# Patient Record
Sex: Female | Born: 1945 | Race: White | Hispanic: No | Marital: Married | Smoking: Former smoker
Health system: Southern US, Community
[De-identification: ages and names within clinical notes are randomized; demographics above are authoritative.]

## PROBLEM LIST (undated history)

## (undated) DIAGNOSIS — G4730 Sleep apnea, unspecified: Secondary | ICD-10-CM

## (undated) DIAGNOSIS — B259 Cytomegaloviral disease, unspecified: Secondary | ICD-10-CM

## (undated) DIAGNOSIS — B019 Varicella without complication: Secondary | ICD-10-CM

## (undated) DIAGNOSIS — T8859XA Other complications of anesthesia, initial encounter: Secondary | ICD-10-CM

## (undated) DIAGNOSIS — J439 Emphysema, unspecified: Secondary | ICD-10-CM

## (undated) DIAGNOSIS — E119 Type 2 diabetes mellitus without complications: Secondary | ICD-10-CM

## (undated) DIAGNOSIS — T4145XA Adverse effect of unspecified anesthetic, initial encounter: Secondary | ICD-10-CM

## (undated) DIAGNOSIS — N390 Urinary tract infection, site not specified: Secondary | ICD-10-CM

## (undated) HISTORY — PX: TUBAL LIGATION: SHX77

## (undated) HISTORY — DX: Sleep apnea, unspecified: G47.30

## (undated) HISTORY — PX: CHOLECYSTECTOMY: SHX55

## (undated) HISTORY — DX: Emphysema, unspecified: J43.9

## (undated) HISTORY — DX: Cytomegaloviral disease, unspecified: B25.9

## (undated) HISTORY — DX: Urinary tract infection, site not specified: N39.0

## (undated) HISTORY — DX: Type 2 diabetes mellitus without complications: E11.9

## (undated) HISTORY — DX: Varicella without complication: B01.9

## (undated) HISTORY — PX: ABDOMINAL HYSTERECTOMY: SHX81

---

## 2007-01-06 ENCOUNTER — Ambulatory Visit: Payer: Self-pay | Admitting: Internal Medicine

## 2007-01-30 ENCOUNTER — Ambulatory Visit: Payer: Self-pay | Admitting: Internal Medicine

## 2007-02-05 ENCOUNTER — Ambulatory Visit: Payer: Self-pay | Admitting: Internal Medicine

## 2007-03-08 ENCOUNTER — Ambulatory Visit: Payer: Self-pay | Admitting: Internal Medicine

## 2007-04-07 ENCOUNTER — Ambulatory Visit: Payer: Self-pay | Admitting: Internal Medicine

## 2007-05-08 ENCOUNTER — Ambulatory Visit: Payer: Self-pay | Admitting: Internal Medicine

## 2007-06-08 ENCOUNTER — Ambulatory Visit: Payer: Self-pay | Admitting: Internal Medicine

## 2007-07-08 ENCOUNTER — Ambulatory Visit: Payer: Self-pay | Admitting: Internal Medicine

## 2007-08-08 ENCOUNTER — Ambulatory Visit: Payer: Self-pay | Admitting: Internal Medicine

## 2007-09-18 ENCOUNTER — Ambulatory Visit: Payer: Self-pay | Admitting: Specialist

## 2007-09-28 ENCOUNTER — Ambulatory Visit: Payer: Self-pay | Admitting: Internal Medicine

## 2007-10-08 ENCOUNTER — Ambulatory Visit: Payer: Self-pay | Admitting: Internal Medicine

## 2007-11-08 ENCOUNTER — Ambulatory Visit: Payer: Self-pay | Admitting: Internal Medicine

## 2007-12-06 ENCOUNTER — Ambulatory Visit: Payer: Self-pay | Admitting: Internal Medicine

## 2008-01-28 ENCOUNTER — Ambulatory Visit: Payer: Self-pay | Admitting: Internal Medicine

## 2008-02-05 ENCOUNTER — Ambulatory Visit: Payer: Self-pay | Admitting: Internal Medicine

## 2008-03-07 ENCOUNTER — Ambulatory Visit: Payer: Self-pay | Admitting: Internal Medicine

## 2008-04-06 ENCOUNTER — Ambulatory Visit: Payer: Self-pay | Admitting: Internal Medicine

## 2008-05-30 ENCOUNTER — Ambulatory Visit: Payer: Self-pay | Admitting: Internal Medicine

## 2008-06-07 ENCOUNTER — Ambulatory Visit: Payer: Self-pay | Admitting: Internal Medicine

## 2008-07-26 ENCOUNTER — Ambulatory Visit: Payer: Self-pay | Admitting: Internal Medicine

## 2008-08-07 ENCOUNTER — Ambulatory Visit: Payer: Self-pay | Admitting: Internal Medicine

## 2008-11-23 ENCOUNTER — Ambulatory Visit: Payer: Self-pay | Admitting: Internal Medicine

## 2008-12-05 ENCOUNTER — Ambulatory Visit: Payer: Self-pay | Admitting: Internal Medicine

## 2009-01-05 ENCOUNTER — Ambulatory Visit: Payer: Self-pay | Admitting: Internal Medicine

## 2009-02-04 ENCOUNTER — Ambulatory Visit: Payer: Self-pay | Admitting: Internal Medicine

## 2009-03-07 ENCOUNTER — Ambulatory Visit: Payer: Self-pay | Admitting: Internal Medicine

## 2009-04-06 ENCOUNTER — Ambulatory Visit: Payer: Self-pay | Admitting: Internal Medicine

## 2009-06-14 ENCOUNTER — Ambulatory Visit: Payer: Self-pay | Admitting: Internal Medicine

## 2009-07-07 ENCOUNTER — Ambulatory Visit: Payer: Self-pay | Admitting: Internal Medicine

## 2009-10-07 HISTORY — PX: LUNG TRANSPLANT, DOUBLE: SHX704

## 2009-11-02 ENCOUNTER — Ambulatory Visit: Payer: Self-pay | Admitting: Unknown Physician Specialty

## 2013-11-26 ENCOUNTER — Encounter: Payer: Self-pay | Admitting: Internal Medicine

## 2013-11-26 ENCOUNTER — Ambulatory Visit (INDEPENDENT_AMBULATORY_CARE_PROVIDER_SITE_OTHER): Payer: PRIVATE HEALTH INSURANCE | Admitting: Internal Medicine

## 2013-11-26 VITALS — BP 152/66 | HR 92 | Temp 98.6°F | Resp 16 | Ht 62.5 in | Wt 160.5 lb

## 2013-11-26 DIAGNOSIS — R5383 Other fatigue: Secondary | ICD-10-CM

## 2013-11-26 DIAGNOSIS — R635 Abnormal weight gain: Secondary | ICD-10-CM

## 2013-11-26 DIAGNOSIS — R5381 Other malaise: Secondary | ICD-10-CM

## 2013-11-26 DIAGNOSIS — G4700 Insomnia, unspecified: Secondary | ICD-10-CM

## 2013-11-26 DIAGNOSIS — Z1239 Encounter for other screening for malignant neoplasm of breast: Secondary | ICD-10-CM

## 2013-11-26 DIAGNOSIS — Z942 Lung transplant status: Secondary | ICD-10-CM

## 2013-11-26 DIAGNOSIS — E785 Hyperlipidemia, unspecified: Secondary | ICD-10-CM

## 2013-11-26 DIAGNOSIS — E559 Vitamin D deficiency, unspecified: Secondary | ICD-10-CM

## 2013-11-26 DIAGNOSIS — Z90710 Acquired absence of both cervix and uterus: Secondary | ICD-10-CM | POA: Insufficient documentation

## 2013-11-26 NOTE — Patient Instructions (Addendum)
Ask your [facility] doctor about getting the Prevnar pneumonia vaccine.  We ca give it to you here if they prefer   This is  my version of a  "Low GI"  Diet:  It will still lower your blood sugars and allow you to lose 4 to 8  lbs  per month if you follow it carefully.  Your goal with exercise is a minimum of 30 minutes of aerobic exercise 5 days per week (Walking does not count once it becomes easy!)    All of the foods can be found at grocery stores and in bulk at [employer].  The Atkins protein bars and shakes are available in more varieties at Target, WalMart and [employer]'s Foods.     7 AM Breakfast:  Choose from the following:  Low carbohydrate Protein  Shakes (I recommend the EAS AdvantEdge "Carb Control" shakes  Or the low carb shakes by Atkins.    2.5 carbs   Arnold's "Sandwhich Thin"toasted  w/ peanut butter (no jelly: about 20 net carbs  "Bagel Thin" with cream cheese and salmon: about 20 carbs   a scrambled egg/bacon/cheese burrito made with Mission's "carb balance" whole wheat tortilla  (about 10 net carbs )   Avoid cereal and bananas, oatmeal and cream of wheat and grits. They are loaded with carbohydrates!   10 AM: high protein snack  Protein bar by Atkins (the snack size, under 200 cal, usually < 6 net carbs).    A stick of cheese:  Around 1 carb,  100 cal     Dannon Light n Fit [place] Yogurt  (80 cal, 8 carbs)  Other so called "protein bars" and Greek yogurts tend to be loaded with carbohydrates.  Remember, in food advertising, the word "energy" is synonymous for " carbohydrate."  Lunch:   A Sandwich using the bread choices listed, Can use any  Eggs,  lunchmeat, grilled meat or canned tuna), avocado, regular mayo/mustard  and cheese.  A Salad using blue cheese, ranch,  Goddess or vinagrette,  No croutons or "confetti" and no "candied nuts" but regular nuts OK.   No pretzels or chips.  Pickles and miniature sweet peppers are a good low carb alternative that provide a "crunch"  The bread  is the only source of carbohydrate in a sandwich and  can be decreased by trying some of these alternatives to traditional loaf bread  Joseph's makes a pita bread and a flat bread that are 50 cal and 4 net carbs available at BJs and WalMart.  This can be toasted to use with hummous as well  Toufayan makes a low carb flatbread that's 100 cal and 9 net carbs available at [employer] and [employer]  Mission makes 2 sizes of  Low carb whole wheat tortilla  (The large one is 210 cal and 6 net carbs) Avoid "Low fat dressings, as well as Reyne Dumas[place]ina and [address] dressings They are loaded with sugar!   3 PM/ Mid day  Snack:  Consider  1 ounce of  almonds, walnuts, pistachios, pecans, peanuts,  Macadamia nuts or a nut medley.  Avoid "granola"; the dried cranberries and raisins are loaded with carbohydrates. Mixed nuts as long as there are no raisins,  cranberries or dried fruit.     6 PM  Dinner:     Meat/fowl/fish with a green salad, and either broccoli, cauliflower, green beans, spinach, brussel sprouts or  Lima beans. DO NOT BREAD THE PROTEIN!!      There is  a low carb pasta by Dreamfield's that is acceptable and tastes great: only 5 digestible carbs/serving.( All grocery stores but BJs carry it )  Try Kai Levins Angelo's chicken piccata or chicken or eggplant parm over low carb pasta.(Lowes and BJs)   Clifton Custard Sanchez's "Carnitas" (pulled pork, no sauce,  0 carbs) or his beef pot roast to make a dinner burrito (at BJ's)  Pesto over low carb pasta (bj's sells a good quality pesto in the center refrigerated section of the deli   Whole wheat pasta is still full of digestible carbs and  Not as low in glycemic index as Dreamfield's.   Brown rice is still rice,  So skip the rice and noodles if you eat [place] or [place] (or at least limit to 1/2 cup)  9 PM snack :   Breyer's "low carb" fudgsicle or  ice cream bar (Carb Smart line), or  Weight Watcher's ice cream bar , or another "no sugar added" ice cream;  a  serving of fresh berries/cherries with whipped cream   Cheese or DANNON'S LlGHT N FIT [place] YOGURT  Avoid bananas, pineapple, grapes  and watermelon on a regular basis because they are high in sugar.  THINK OF THEM AS DESSERT  Remember that snack Substitutions should be less than 10 NET carbs per serving and meals < 20 carbs. Remember to subtract fiber grams to get the "net carbs."

## 2013-11-26 NOTE — Progress Notes (Signed)
Patient ID: Darlene Carpenter, female   DOB: 12-07-1945, 68 y.o.   MRN: [id number]    Patient Active Problem List   Diagnosis Date Noted  . Weight gain 11/28/2013  . 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 Insomnia 11/28/2013  . S/P vaginal hysterectomy 11/26/2013    Subjective:  CC:   Chief Complaint  Patient presents with  . Establish Care    HPI:   Darlene PopperBetty Hanceis a 68 y.o. female who presents Neutropenia.  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  getting labs every 2 weeks.,  Since her dose was decreased. She has a history of rejection, last year,    Weight gain.  Since her episode of lung rejection last year she has been unable to exercise  And gained wt of 10 lbs.     History of CMV,  Getting a bronch March 11.    Chronic insomnia. Uses ambien, hasn't slept without it for years. Pays out of pocket.  Last colonoscopy 2011,  3 polyps , [place] ,  Waiting for neutropenia to resolve.,   Some CKD last  Cr 1.75  highest was 2.8  during rejection       Past Medical History  Diagnosis Date  . Chicken pox   . Emphysema of lung   . UTI (lower urinary tract infection)    Allergies  Allergen Reactions  . Shellfish Allergy Swelling    Past Surgical History  Procedure Laterality Date  . Cholecystectomy    . Abdominal hysterectomy    . Lung transplant, double Bilateral 2011    History   Social History  . Marital Status: Married    Spouse Name: N/A    Number of Children: N/A  . Years of Education: N/A   Occupational History  . Not on file.   Social History Main Topics  . Smoking status: Former Smoker -- 1.00 packs/day for 45 years    Types: Cigarettes    Quit date: 11/26/2006  . Smokeless tobacco: Not on file  . Alcohol Use: No  . Drug Use: No  . Sexual Activity: No   Other Topics Concern  . Not on file   Social History Narrative  . No narrative on file   Family History  Problem Relation Age of  Onset  . Hypertension Mother   . Hypertension Father   . Cancer Brother     Review of Systems:   The rest of the review of systems was negative except those addressed in the HPI.      Objective:  BP 152/66  Pulse 92  Temp(Src) 98.6 F (37 C) (Oral)  Resp 16  Ht 5' 2.5" (1.588 m)  Wt 160 lb 8 oz (72.802 kg)  BMI 28.87 kg/m2  SpO2 99%  General appearance: alert, cooperative and appears stated age Ears: normal TM's and external ear canals both ears Throat: lips, mucosa, and tongue normal; teeth and gums normal Neck: no adenopathy, no carotid bruit, supple, symmetrical, trachea midline and thyroid not enlarged, symmetric, no tenderness/mass/nodules Back: symmetric, no curvature. ROM normal. No CVA tenderness. Lungs: clear to auscultation bilaterally Heart: regular rate and rhythm, S1, S2 normal, no murmur, click, rub or gallop Abdomen: soft, non-tender; bowel sounds normal; no masses,  no organomegaly Pulses: 2+ and symmetric Skin: Skin color, texture, turgor normal. No rashes or lesions Lymph nodes: Cervical, supraclavicular, and axillary nodes normal.  Assessment and Plan:  S/P vaginal hysterectomy 1990's  secondary to  DUB,  [place] .   Weight gain I have addressed  BMI and recommended a low glycemic index diet utilizing smaller more frequent meals to increase metabolism.  I have also recommended that patient start exercising with a goal of 30 minutes of aerobic exercise a minimum of 5 days per week. Screening for lipid disorders, thyroid and diabetes to be done today.    S/P lung transplant Secondary to COPD. 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 Now with neutropenia secondary to cell cept.   Insomnia ambien dependent.    Updated Medication List Outpatient Encounter Prescriptions as of 11/26/2013  Medication Sig  . cetirizine HCl (ZYRTEC) 5 MG/5ML SYRP Take 5 mg by mouth daily.  . furosemide (LASIX) 20 MG tablet Take 20 mg by mouth daily as needed.  . magnesium  oxide (MAG-OX) 400 MG tablet Take 800 mg by mouth 2 (two) times daily.  . metoprolol tartrate (LOPRESSOR) 25 MG tablet Take 0.5 tablets by mouth 2 (two) times daily.  . mycophenolate (CELLCEPT) 500 MG tablet Take 250 mg by mouth 2 (two) times daily.  . ondansetron (ZOFRAN) 4 MG tablet Take 4 mg by mouth every 8 (eight) hours as needed for nausea or vomiting.  . tacrolimus (PROGRAF) 0.5 MG capsule Take 0.5 mg by mouth Nightly.  . tacrolimus (PROGRAF) 1 MG capsule Take 1 mg by mouth 2 (two) times daily.  . valGANciclovir (VALCYTE) 450 MG tablet Take by mouth 2 (two) times daily.  Marland Kitchen zolpidem (AMBIEN) 10 MG tablet Take 10 mg by mouth at bedtime as needed for sleep.     Orders Placed This Encounter  Procedures  . MM DIGITAL SCREENING BILATERAL  . Lipid panel  . TSH  . Vit D  25 hydroxy (rtn osteoporosis monitoring)    No Follow-up on file.

## 2013-11-26 NOTE — Assessment & Plan Note (Signed)
1990's  secondary to [place],  [place] .

## 2013-11-28 ENCOUNTER — Encounter: Payer: Self-pay | Admitting: Internal Medicine

## 2013-11-28 DIAGNOSIS — G4700 Insomnia, unspecified: Secondary | ICD-10-CM | POA: Insufficient documentation

## 2013-11-28 DIAGNOSIS — Z942 Lung transplant status: Secondary | ICD-10-CM | POA: Insufficient documentation

## 2013-11-28 DIAGNOSIS — R635 Abnormal weight gain: Secondary | ICD-10-CM | POA: Insufficient documentation

## 2013-11-28 NOTE — Assessment & Plan Note (Signed)
I have addressed  BMI and recommended a low glycemic index diet utilizing smaller more frequent meals to increase metabolism.  I have also recommended that patient start exercising with a goal of 30 minutes of aerobic exercise a minimum of 5 days per week. Screening for lipid disorders, thyroid and diabetes to be done today.   

## 2013-11-28 NOTE — Assessment & Plan Note (Signed)
Secondary to COPD. 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 Now with neutropenia secondary to cell cept.

## 2013-11-28 NOTE — Assessment & Plan Note (Signed)
ambien dependent.

## 2013-12-14 ENCOUNTER — Ambulatory Visit: Payer: Self-pay | Admitting: Internal Medicine

## 2014-01-13 ENCOUNTER — Encounter: Payer: Self-pay | Admitting: Internal Medicine

## 2014-02-07 ENCOUNTER — Encounter: Payer: Self-pay | Admitting: Internal Medicine

## 2014-02-07 DIAGNOSIS — N183 Chronic kidney disease, stage 3 unspecified: Secondary | ICD-10-CM

## 2014-02-07 DIAGNOSIS — Z942 Lung transplant status: Secondary | ICD-10-CM

## 2014-02-07 DIAGNOSIS — J449 Chronic obstructive pulmonary disease, unspecified: Secondary | ICD-10-CM | POA: Insufficient documentation

## 2014-02-07 DIAGNOSIS — D803 Selective deficiency of immunoglobulin G [IgG] subclasses: Secondary | ICD-10-CM | POA: Insufficient documentation

## 2014-02-07 DIAGNOSIS — B259 Cytomegaloviral disease, unspecified: Secondary | ICD-10-CM | POA: Insufficient documentation

## 2014-02-07 DIAGNOSIS — D759 Disease of blood and blood-forming organs, unspecified: Secondary | ICD-10-CM | POA: Insufficient documentation

## 2014-02-07 NOTE — Assessment & Plan Note (Signed)
followed by [facility].  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

## 2014-03-07 ENCOUNTER — Emergency Department: Payer: Self-pay | Admitting: Emergency Medicine

## 2016-03-11 LAB — BASIC METABOLIC PANEL
CREATININE: 1.6 mg/dL — AB (ref 0.5–1.1)
GLUCOSE: 156 mg/dL
POTASSIUM: 3.5 mmol/L (ref 3.4–5.3)
Sodium: 141 mmol/L (ref 137–147)

## 2016-03-11 LAB — LIPID PANEL
CHOLESTEROL: 148 mg/dL (ref 0–200)
HDL: 49 mg/dL (ref 35–70)
LDL Cholesterol: 71 mg/dL
TRIGLYCERIDES: 142 mg/dL (ref 40–160)

## 2016-03-11 LAB — HEMOGLOBIN A1C: Hemoglobin A1C: 7

## 2016-07-02 ENCOUNTER — Ambulatory Visit: Payer: Medicare Other | Attending: Otolaryngology

## 2016-07-02 DIAGNOSIS — G4700 Insomnia, unspecified: Secondary | ICD-10-CM | POA: Diagnosis present

## 2016-07-02 DIAGNOSIS — Z942 Lung transplant status: Secondary | ICD-10-CM | POA: Diagnosis not present

## 2016-07-02 DIAGNOSIS — R0683 Snoring: Secondary | ICD-10-CM | POA: Insufficient documentation

## 2016-07-10 LAB — HEPATIC FUNCTION PANEL
ALT: 26 U/L (ref 7–35)
AST: 23 U/L (ref 13–35)
Alkaline Phosphatase: 124 U/L (ref 25–125)
BILIRUBIN, TOTAL: 0.7 mg/dL

## 2016-07-10 LAB — CBC AND DIFFERENTIAL
HEMATOCRIT: 36 % (ref 36–46)
HEMOGLOBIN: 12.5 g/dL (ref 12.0–16.0)
PLATELETS: 70 10*3/uL — AB (ref 150–399)
WBC: 6.6 10*3/mL

## 2016-08-09 ENCOUNTER — Encounter: Payer: Self-pay | Admitting: *Deleted

## 2016-08-09 ENCOUNTER — Ambulatory Visit (INDEPENDENT_AMBULATORY_CARE_PROVIDER_SITE_OTHER): Payer: Medicaid Other | Admitting: Internal Medicine

## 2016-08-09 ENCOUNTER — Encounter: Payer: Self-pay | Admitting: Internal Medicine

## 2016-08-09 VITALS — BP 140/76 | HR 74 | Temp 98.2°F | Resp 12 | Ht 62.0 in | Wt 180.5 lb

## 2016-08-09 DIAGNOSIS — N183 Chronic kidney disease, stage 3 unspecified: Secondary | ICD-10-CM

## 2016-08-09 DIAGNOSIS — E559 Vitamin D deficiency, unspecified: Secondary | ICD-10-CM | POA: Diagnosis not present

## 2016-08-09 DIAGNOSIS — Z1239 Encounter for other screening for malignant neoplasm of breast: Secondary | ICD-10-CM

## 2016-08-09 DIAGNOSIS — E119 Type 2 diabetes mellitus without complications: Secondary | ICD-10-CM

## 2016-08-09 DIAGNOSIS — Z1231 Encounter for screening mammogram for malignant neoplasm of breast: Secondary | ICD-10-CM

## 2016-08-09 DIAGNOSIS — E1122 Type 2 diabetes mellitus with diabetic chronic kidney disease: Secondary | ICD-10-CM

## 2016-08-09 DIAGNOSIS — E1129 Type 2 diabetes mellitus with other diabetic kidney complication: Secondary | ICD-10-CM | POA: Insufficient documentation

## 2016-08-09 DIAGNOSIS — I872 Venous insufficiency (chronic) (peripheral): Secondary | ICD-10-CM

## 2016-08-09 DIAGNOSIS — G4700 Insomnia, unspecified: Secondary | ICD-10-CM

## 2016-08-09 LAB — LIPID PANEL
CHOL/HDL RATIO: 3
Cholesterol: 157 mg/dL (ref 0–200)
HDL: 57.6 mg/dL (ref >39.00)
LDL Cholesterol: 60 mg/dL (ref 0–99)
NonHDL: 99.11
TRIGLYCERIDES: 194 mg/dL — AB (ref 0.0–149.0)
VLDL: 38.8 mg/dL (ref 0.0–40.0)

## 2016-08-09 LAB — MICROALBUMIN / CREATININE URINE RATIO
Creatinine,U: 357.1 mg/dL
MICROALB/CREAT RATIO: 0.8 mg/g (ref 0.0–30.0)
Microalb, Ur: 3 mg/dL — ABNORMAL HIGH (ref 0.0–1.9)

## 2016-08-09 LAB — HEMOGLOBIN A1C: Hgb A1c MFr Bld: 7.1 % — ABNORMAL HIGH (ref 4.6–6.5)

## 2016-08-09 LAB — VITAMIN D 25 HYDROXY (VIT D DEFICIENCY, FRACTURES): VITD: 49.19 ng/mL (ref 30.00–100.00)

## 2016-08-09 NOTE — Progress Notes (Signed)
Pre-visit discussion using our clinic review tool. No additional management support is needed unless otherwise documented below in the visit note.  

## 2016-08-09 NOTE — Progress Notes (Signed)
Patient ID: Darlene Carpenter, female    DOB: January 14, 1946  Age: 70 y.o. MRN: [id number]  The patient is here for annual Medicare wellness examination and management of other chronic and acute problems,  But has not been seen in over 2 years, .  Last seen Feb 2015 as a new patient.     Overdue for mammogram  And [facility]  Despite history of 3 polyps on prior scope (done prior to lung transplant , she thinks in 2010 somewhere in [place]) likely due to pulmonary issues and thrombocytopenia.    Cc: bilateral Edema, legs. Worse by end of day.  No ulcerations, pain or recent injury 2) Spontaneous bruises to legs and arms.  Has history of ITP, drug induced neutropenia, MGUS Platelets were 70K  last month , stable.  In  the past has had treatments with IVIG and steroids Gums do not bleed, has not seen any  blood in urine , but has had BRBPR associated with loose stools,    Occurring once or twice per month   3) [facility],  Prograf dose was reduced recently,  And IVIG was increased. [facility]. Has bronchoscopy Nov 17, routine annualHistory of early rejection managed medically in 2013.  Has no intention  of going through another  lung transplant if offered or needed,  Aware of the prognosis . Marland Kitchen.    4) Chronic insomnia: Her new doctor, Dr Karie MainlandAli,  Has discontinued her Ambien and given her a trial of rozerem.  She reports only sleeping three hours per night for the past month  Was referred for sleep study recently and reportedly did not sleep at all.  Darlene Carpenter was refilled per review of notes from [facility] via EPIC portal on Sept    5) Has had an increase in occurrences of skin cancer due to immunosuppressive therapy  6) History of Type 2 Dm previously on medication  But recently her Fasting glucose  was 130   7) CKD;  Prior workup unclear.   Not fasting, Ate toast today   Received annual flu vaccines.   Depression screen: there are no  signs or vegative symptoms of depression- irritability, change in appetite, anhedonia, sadness/tearfullness.  Cognitive assessment: the patient manages all their financial and personal affairs and is actively engaged. They could relate day,date,year and events; recalled 2/3 objects at 3 minutes; performed clock-face test normally.  The following portions of the patient's history were reviewed and updated as appropriate: allergies, current medications, past family history, past medical history,  past surgical history, past social history  and problem list.  Visual acuity was not assessed per patient preference since she has regular follow up with her ophthalmologist. Hearing and body mass index were assessed and reviewed.   During the course of the visit the patient was educated and counseled about appropriate screening and preventive services including : fall prevention , diabetes screening, nutrition counseling, colorectal cancer screening, and recommended immunizations.    CC: The primary encounter diagnosis was Vitamin D deficiency. Diagnoses of Venous (peripheral) insufficiency, Diabetes mellitus without complication (HCC), Breast cancer screening, CKD (chronic kidney disease) stage 3, GFR 30-59 ml/min, Type 2 diabetes mellitus with stage 3 chronic kidney disease, without long-term current use of insulin (HCC), and Insomnia, unspecified type were also pertinent to this visit.  History Darlene Carpenter has a past medical history of Chicken pox; Emphysema of lung (HCC); and UTI (lower urinary tract  infection).   She has a past surgical history that includes Cholecystectomy; Abdominal hysterectomy; and Lung transplant, double (Bilateral, 2011).   Her family history includes Cancer in her brother; Hypertension in her father and mother.She reports that she quit smoking about 9 years ago. Her smoking use included Cigarettes. She has a 45.00 pack-year smoking history. She does not have any smokeless tobacco  history on file. She reports that she does not drink alcohol or use drugs.  Outpatient Medications Prior to Visit  Medication Sig Dispense Refill  . cetirizine HCl (ZYRTEC) 5 MG/5ML SYRP Take 5 mg by mouth daily.    . magnesium oxide (MAG-OX) 400 MG tablet Take 800 mg by mouth 2 (two) times daily.    . metoprolol tartrate (LOPRESSOR) 25 MG tablet Take 0.5 tablets by mouth 2 (two) times daily.    . mycophenolate (CELLCEPT) 500 MG tablet Take 250 mg by mouth 2 (two) times daily.    . tacrolimus (PROGRAF) 0.5 MG capsule Take 0.5 mg by mouth Nightly.    . tacrolimus (PROGRAF) 1 MG capsule Take 1 mg by mouth 2 (two) times daily.    . valGANciclovir (VALCYTE) 450 MG tablet Take 450 mg by mouth 3 (three) times a week.     . furosemide (LASIX) 20 MG tablet Take 20 mg by mouth daily as needed.    . ondansetron (ZOFRAN) 4 MG tablet Take 4 mg by mouth every 8 (eight) hours as needed for nausea or vomiting.    Marland Kitchen zolpidem (AMBIEN) 10 MG tablet Take 10 mg by mouth at bedtime as needed for sleep.     No facility-administered medications prior to visit.     Review of Systems  Patient denies headache, fevers, malaise, unintentional weight loss, skin rash, eye pain, sinus congestion and sinus pain, sore throat, dysphagia,  hemoptysis , cough, dyspnea, wheezing, chest pain, palpitations, orthopnea, edema, abdominal pain, nausea, melena, diarrhea, constipation, flank pain, dysuria, hematuria, urinary  Frequency, nocturia, numbness, tingling, seizures,  Focal weakness, Loss of consciousness,  Tremor,depression, anxiety, and suicidal ideation.     Objective:  BP 140/76   Pulse 74   Temp 98.2 F (36.8 C) (Oral)   Resp 12   Ht 5\' 2"  (1.575 m)   Wt 180 lb 8 oz (81.9 kg)   SpO2 96%   BMI 33.01 kg/m   Physical Exam:  General appearance: alert, cooperative and appears stated age Ears: normal TM's and external ear canals both ears Throat: lips, mucosa, and tongue normal; teeth and gums normal Neck: no  adenopathy, no carotid bruit, supple, symmetrical, trachea midline and thyroid not enlarged, symmetric, no tenderness/mass/nodules Back: symmetric, no curvature. ROM normal. No CVA tenderness. Lungs: clear to auscultation bilaterally Heart: regular rate and rhythm, S1, S2 normal, no murmur, click, rub or gallop Abdomen: soft, non-tender; bowel sounds normal; no masses,  no organomegaly Pulses: 2+ and symmetric Skin: Skin color, texture, turgor normal. No rashes or lesions Lymph nodes: Cervical, supraclavicular, and axillary nodes normal.    Assessment & Plan:   Problem List Items Addressed This Visit    Insomnia    She has been [place] dependent for years,  But per patient her other doctor discontinued this medication She has been suffering from lack of sleep since her medication was changed to rozerem.  I do not have enough background information to override his decision at this point.       CKD (chronic kidney disease) stage 3, GFR 30-59 ml/min    GFR was  29 ml/min in 2015 per [facility] notes and appears to be stable,  There is no records of Nephrology evaluation, so the etiology of the diagnosis is unclear . She has microalbuminuria.  Advised to continue avoiding nephrotoxic agents, referral pending.  No results found for: CREATININE Lab Results  Component Value Date   MICROALBUR 3.0 (H) 08/09/2016           Venous (peripheral) insufficiency    Suggested by exam.  Compression stockings, leg elevation advised. Sleep study was done recently per records but diangosis wasnot made due to persistent insomnia without ambien.    Avoid diuretics givne CKD      Relevant Orders   DME Other see comment   DM (diabetes mellitus), type 2 with renal complications (HCC)    Historically well-controlled on diet alone .  Has had no follow up in 2.5 years and now noting elevated fasting sugars to 130.  hemoglobin A1c has been consistently at or  less than 7.0 . Patient is up-to-date on eye exams and  foot exam is normal today. Patient had  CKD and microalbuminuria is noted today . Patient is not on statin therapy for CAD risk reduction , howevere LDL is < 100.  She is  not taking an  ACE/ARB for reduction in proteinuria.    Lab Results  Component Value Date   HGBA1C 7.1 (H) 08/09/2016   Lab Results  Component Value Date   MICROALBUR 3.0 (H) 08/09/2016   Lab Results  Component Value Date   CHOL 157 08/09/2016   HDL 57.60 08/09/2016   LDLCALC 60 08/09/2016   TRIG 194.0 (H) 08/09/2016   CHOLHDL 3 08/09/2016          Other Visit Diagnoses    Vitamin D deficiency    -  Primary   Relevant Orders   VITAMIN D 25 Hydroxy (Vit-D Deficiency, Fractures) (Completed)   Breast cancer screening       Relevant Orders   MM DIGITAL SCREENING BILATERAL    A total of 40 minutes was spent with patient more than half of which was spent in counseling patient on the above mentioned issues , reviewing and explaining recent labs and imaging studies done, and coordination of care.   I am having Darlene Carpenter maintain her metoprolol tartrate, furosemide, magnesium oxide, ondansetron, zolpidem, cetirizine HCl, tacrolimus, tacrolimus, mycophenolate, valGANciclovir, azithromycin, and Immune Globulin 10%.  Meds ordered this encounter  Medications  . azithromycin (ZITHROMAX) 250 MG tablet    Sig: Take by mouth.  . Immune Globulin 10% (GAMUNEX-C) 10 GM/100ML SOLN    Sig: Inject 40 mg into the vein 3 (three) times a week.     There are no discontinued medications.  Follow-up: Return in about 3 months (around 11/09/2016) for CPE.   Sherlene ShamsULLO, Satrina Magallanes L, MD

## 2016-08-09 NOTE — Patient Instructions (Addendum)
You need  The Pneumovax 23 valent vaccine . You can get this at the pharmacy   We are checking your cholesterol and diabetes today  Your leg swelling is likely due to "venous insuffiiciency" and can be controlled with compression stockings  Your mammogram has been ordered   We will do your annual exam in 3 months

## 2016-08-11 NOTE — Assessment & Plan Note (Signed)
GFR was 29 ml/min in 2015 per [facility] notes and appears to be stable,  There is no records of Nephrology evaluation, so the etiology of the diagnosis is unclear . She has microalbuminuria.  Advised to continue avoiding nephrotoxic agents, referral pending.  No results found for: CREATININE Lab Results  Component Value Date   MICROALBUR 3.0 (H) 08/09/2016

## 2016-08-11 NOTE — Assessment & Plan Note (Signed)
Historically well-controlled on diet alone .  Has had no follow up in 2.5 years and now noting elevated fasting sugars to 130.  hemoglobin A1c has been consistently at or  less than 7.0 . Patient is up-to-date on eye exams and foot exam is normal today. Patient had  CKD and microalbuminuria is noted today . Patient is not on statin therapy for CAD risk reduction , howevere LDL is < 100.  She is  not taking an  ACE/ARB for reduction in proteinuria.    Lab Results  Component Value Date   HGBA1C 7.1 (H) 08/09/2016   Lab Results  Component Value Date   MICROALBUR 3.0 (H) 08/09/2016   Lab Results  Component Value Date   CHOL 157 08/09/2016   HDL 57.60 08/09/2016   LDLCALC 60 08/09/2016   TRIG 194.0 (H) 08/09/2016   CHOLHDL 3 08/09/2016

## 2016-08-11 NOTE — Assessment & Plan Note (Signed)
She has been [place] for years,  But per patient her other doctor discontinued this medication She has been suffering from lack of sleep since her medication was changed to rozerem.  I do not have enough background information to override his decision at this point.

## 2016-08-11 NOTE — Assessment & Plan Note (Signed)
Suggested by exam.  Compression stockings, leg elevation advised. Sleep study was done recently per records but diangosis wasnot made due to persistent insomnia without ambien.    Avoid diuretics givne CKD

## 2016-08-15 ENCOUNTER — Telehealth: Payer: Self-pay | Admitting: *Deleted

## 2016-08-15 NOTE — Telephone Encounter (Signed)
Patient is requesting that we fax labs to Dr Karie MainlandAli her Lung specialist  [phone]  Fax .  I informed her of labs drawn. ? Ok to fax

## 2016-08-15 NOTE — Telephone Encounter (Signed)
Patient requested lab results Pt contact [phone]

## 2016-08-15 NOTE — Telephone Encounter (Signed)
OK TO FAX ALL LABS AS REQUESTED

## 2016-08-16 NOTE — Telephone Encounter (Signed)
Labs faxed

## 2016-08-21 ENCOUNTER — Encounter: Payer: Self-pay | Admitting: Internal Medicine

## 2016-09-10 ENCOUNTER — Ambulatory Visit: Payer: Medicare Other | Attending: Otolaryngology

## 2016-09-10 DIAGNOSIS — G4733 Obstructive sleep apnea (adult) (pediatric): Secondary | ICD-10-CM | POA: Insufficient documentation

## 2016-09-10 DIAGNOSIS — G2581 Restless legs syndrome: Secondary | ICD-10-CM | POA: Insufficient documentation

## 2016-09-10 DIAGNOSIS — R0683 Snoring: Secondary | ICD-10-CM | POA: Diagnosis not present

## 2016-09-11 LAB — CBC AND DIFFERENTIAL
Platelets: 86 10*3/uL — AB (ref 150–399)
WBC: 8.3 10*3/mL

## 2016-10-01 ENCOUNTER — Ambulatory Visit
Admission: RE | Admit: 2016-10-01 | Discharge: 2016-10-01 | Disposition: A | Discharge status: Home / Self Care | Payer: Medicare Other | Source: Ambulatory Visit | Attending: Internal Medicine | Admitting: Internal Medicine

## 2016-10-01 DIAGNOSIS — Z1231 Encounter for screening mammogram for malignant neoplasm of breast: Secondary | ICD-10-CM | POA: Diagnosis present

## 2016-11-11 ENCOUNTER — Ambulatory Visit (INDEPENDENT_AMBULATORY_CARE_PROVIDER_SITE_OTHER): Payer: Medicare Other | Admitting: Internal Medicine

## 2016-11-11 ENCOUNTER — Encounter: Payer: Self-pay | Admitting: Internal Medicine

## 2016-11-11 VITALS — BP 160/80 | HR 101 | Temp 98.4°F | Resp 16 | Ht 62.0 in | Wt 181.0 lb

## 2016-11-11 DIAGNOSIS — Z780 Asymptomatic menopausal state: Secondary | ICD-10-CM

## 2016-11-11 DIAGNOSIS — F5104 Psychophysiologic insomnia: Secondary | ICD-10-CM | POA: Diagnosis not present

## 2016-11-11 DIAGNOSIS — Z0000 Encounter for general adult medical examination without abnormal findings: Secondary | ICD-10-CM | POA: Diagnosis not present

## 2016-11-11 DIAGNOSIS — Z23 Encounter for immunization: Secondary | ICD-10-CM | POA: Diagnosis not present

## 2016-11-11 DIAGNOSIS — E1122 Type 2 diabetes mellitus with diabetic chronic kidney disease: Secondary | ICD-10-CM | POA: Diagnosis not present

## 2016-11-11 DIAGNOSIS — N183 Chronic kidney disease, stage 3 (moderate): Secondary | ICD-10-CM | POA: Diagnosis not present

## 2016-11-11 DIAGNOSIS — D759 Disease of blood and blood-forming organs, unspecified: Secondary | ICD-10-CM

## 2016-11-11 DIAGNOSIS — Z1211 Encounter for screening for malignant neoplasm of colon: Secondary | ICD-10-CM

## 2016-11-11 LAB — LIPID PANEL
Cholesterol: 137 mg/dL (ref 0–200)
HDL: 45.5 mg/dL (ref >39.00)
LDL Cholesterol: 54 mg/dL (ref 0–99)
NONHDL: 91.26
Total CHOL/HDL Ratio: 3
Triglycerides: 186 mg/dL — ABNORMAL HIGH (ref 0.0–149.0)
VLDL: 37.2 mg/dL (ref 0.0–40.0)

## 2016-11-11 LAB — MICROALBUMIN / CREATININE URINE RATIO
CREATININE, U: 265.4 mg/dL
MICROALB/CREAT RATIO: 0.8 mg/g (ref 0.0–30.0)
Microalb, Ur: 2.1 mg/dL — ABNORMAL HIGH (ref 0.0–1.9)

## 2016-11-11 MED ORDER — ESCITALOPRAM OXALATE 10 MG PO TABS: 10.0000 mg | ORAL_TABLET | Freq: Every day | ORAL | 3 refills | Status: DC

## 2016-11-11 NOTE — Progress Notes (Signed)
Pre visit review using our clinic review tool, if applicable. No additional management support is needed unless otherwise documented below in the visit note. 

## 2016-11-11 NOTE — Patient Instructions (Addendum)
FOR YOUR ANXIETY:   Please start the Lexapro (escitalopram) at 1/2 tablet daily in the evening for the first few days to avoid nausea.  You can increase to a full tablet after 7 days if you have not developed side effects of nausea.  If the lexapro interferes with your sleep, take it in the morning instead   YOU CAN LOSE WEIGHT BY CUTTING BACK ON THE SUGAR (CARBOHYDRATES) IN YOUR DIET USING THE "LOW GLYCEMIC INDEX" LIST OF FOODS I GAVE YOU TODAY  I WILL CHECK TO SEE IF WE CAN DO THE COLOGUARD THIS YEAR INSTEAD OF A COLONOSCOPY   Health Maintenance for Postmenopausal Women Introduction Menopause is a normal process in which your reproductive ability comes to an end. This process happens gradually over a span of months to years, usually between the ages of 67 and 41. Menopause is complete when you have missed 12 consecutive menstrual periods. It is important to talk with your health care provider about some of the most common conditions that affect postmenopausal women, such as heart disease, cancer, and bone loss (osteoporosis). Adopting a healthy lifestyle and getting preventive care can help to promote your health and wellness. Those actions can also lower your chances of developing some of these common conditions. What should I know about menopause? During menopause, you may experience a number of symptoms, such as:  Moderate-to-severe hot flashes.  Night sweats.  Decrease in sex drive.  Mood swings.  Headaches.  Tiredness.  Irritability.  Memory problems.  Insomnia. Choosing to treat or not to treat menopausal changes is an individual decision that you make with your health care provider. What should I know about hormone replacement therapy and supplements? Hormone therapy products are effective for treating symptoms that are associated with menopause, such as hot flashes and night sweats. Hormone replacement carries certain risks, especially as you become older. If you are  thinking about using estrogen or estrogen with progestin treatments, discuss the benefits and risks with your health care provider. What should I know about heart disease and stroke? Heart disease, heart attack, and stroke become more likely as you age. This may be due, in part, to the hormonal changes that your body experiences during menopause. These can affect how your body processes dietary fats, triglycerides, and cholesterol. Heart attack and stroke are both medical emergencies. There are many things that you can do to help prevent heart disease and stroke:  Have your blood pressure checked at least every 1-2 years. High blood pressure causes heart disease and increases the risk of stroke.  If you are 37-96 years old, ask your health care provider if you should take aspirin to prevent a heart attack or a stroke.  Do not use any tobacco products, including cigarettes, chewing tobacco, or electronic cigarettes. If you need help quitting, ask your health care provider.  It is important to eat a healthy diet and maintain a healthy weight.  Be sure to include plenty of vegetables, fruits, low-fat dairy products, and lean protein.  Avoid eating foods that are high in solid fats, added sugars, or salt (sodium).  Get regular exercise. This is one of the most important things that you can do for your health.  Try to exercise for at least 150 minutes each week. The type of exercise that you do should increase your heart rate and make you sweat. This is known as moderate-intensity exercise.  Try to do strengthening exercises at least twice each week. Do these in addition to  the moderate-intensity exercise.  Know your numbers.Ask your health care provider to check your cholesterol and your blood glucose. Continue to have your blood tested as directed by your health care provider. What should I know about cancer screening? There are several types of cancer. Take the following steps to reduce your  risk and to catch any cancer development as early as possible. Breast Cancer  Practice breast self-awareness.  This means understanding how your breasts normally appear and feel.  It also means doing regular breast self-exams. Let your health care provider know about any changes, no matter how small.  If you are 32 or older, have a clinician do a breast exam (clinical breast exam or CBE) every year. Depending on your age, family history, and medical history, it may be recommended that you also have a yearly breast X-ray (mammogram).  If you have a family history of breast cancer, talk with your health care provider about genetic screening.  If you are at high risk for breast cancer, talk with your health care provider about having an MRI and a mammogram every year.  Breast cancer (BRCA) gene test is recommended for women who have family members with BRCA-related cancers. Results of the assessment will determine the need for genetic counseling and BRCA1 and for BRCA2 testing. BRCA-related cancers include these types:  Breast. This occurs in males or females.  Ovarian.  Tubal. This may also be called fallopian tube cancer.  Cancer of the abdominal or pelvic lining (peritoneal cancer).  Prostate.  Pancreatic. Cervical, Uterine, and Ovarian Cancer  Your health care provider may recommend that you be screened regularly for cancer of the pelvic organs. These include your ovaries, uterus, and vagina. This screening involves a pelvic exam, which includes checking for microscopic changes to the surface of your cervix (Pap test).  For women ages 21-65, health care providers may recommend a pelvic exam and a Pap test every three years. For women ages 61-65, they may recommend the Pap test and pelvic exam, combined with testing for human papilloma virus (HPV), every five years. Some types of HPV increase your risk of cervical cancer. Testing for HPV may also be done on women of any age who have  unclear Pap test results.  Other health care providers may not recommend any screening for nonpregnant women who are considered low risk for pelvic cancer and have no symptoms. Ask your health care provider if a screening pelvic exam is right for you.  If you have had past treatment for cervical cancer or a condition that could lead to cancer, you need Pap tests and screening for cancer for at least 20 years after your treatment. If Pap tests have been discontinued for you, your risk factors (such as having a new sexual partner) need to be reassessed to determine if you should start having screenings again. Some women have medical problems that increase the chance of getting cervical cancer. In these cases, your health care provider may recommend that you have screening and Pap tests more often.  If you have a family history of uterine cancer or ovarian cancer, talk with your health care provider about genetic screening.  If you have vaginal bleeding after reaching menopause, tell your health care provider.  There are currently no reliable tests available to screen for ovarian cancer. Lung Cancer  Lung cancer screening is recommended for adults 35-36 years old who are at high risk for lung cancer because of a history of smoking. A yearly low-dose CT  scan of the lungs is recommended if you:  Currently smoke.  Have a history of at least 30 pack-years of smoking and you currently smoke or have quit within the past 15 years. A pack-year is smoking an average of one pack of cigarettes per day for one year. Yearly screening should:  Continue until it has been 15 years since you quit.  Stop if you develop a health problem that would prevent you from having lung cancer treatment. Colorectal Cancer  This type of cancer can be detected and can often be prevented.  Routine colorectal cancer screening usually begins at age 67 and continues through age 11.  If you have risk factors for colon cancer,  your health care provider may recommend that you be screened at an earlier age.  If you have a family history of colorectal cancer, talk with your health care provider about genetic screening.  Your health care provider may also recommend using home test kits to check for hidden blood in your stool.  A small camera at the end of a tube can be used to examine your colon directly (sigmoidoscopy or colonoscopy). This is done to check for the earliest forms of colorectal cancer.  Direct examination of the colon should be repeated every 5-10 years until age 62. However, if early forms of precancerous polyps or small growths are found or if you have a family history or genetic risk for colorectal cancer, you may need to be screened more often. Skin Cancer  Check your skin from head to toe regularly.  Monitor any moles. Be sure to tell your health care provider:  About any new moles or changes in moles, especially if there is a change in a mole's shape or color.  If you have a mole that is larger than the size of a pencil eraser.  If any of your family members has a history of skin cancer, especially at a young age, talk with your health care provider about genetic screening.  Always use sunscreen. Apply sunscreen liberally and repeatedly throughout the day.  Whenever you are outside, protect yourself by wearing long sleeves, pants, a wide-brimmed hat, and sunglasses. What should I know about osteoporosis? Osteoporosis is a condition in which bone destruction happens more quickly than new bone creation. After menopause, you may be at an increased risk for osteoporosis. To help prevent osteoporosis or the bone fractures that can happen because of osteoporosis, the following is recommended:  If you are 91-26 years old, get at least 1,000 mg of calcium and at least 600 mg of vitamin D per day.  If you are older than age 4 but younger than age 37, get at least 1,200 mg of calcium and at least 600  mg of vitamin D per day.  If you are older than age 92, get at least 1,200 mg of calcium and at least 800 mg of vitamin D per day. Smoking and excessive alcohol intake increase the risk of osteoporosis. Eat foods that are rich in calcium and vitamin D, and do weight-bearing exercises several times each week as directed by your health care provider. What should I know about how menopause affects my mental health? Depression may occur at any age, but it is more common as you become older. Common symptoms of depression include:  Low or sad mood.  Changes in sleep patterns.  Changes in appetite or eating patterns.  Feeling an overall lack of motivation or enjoyment of activities that you previously enjoyed.  Frequent crying spells. Talk with your health care provider if you think that you are experiencing depression. What should I know about immunizations? It is important that you get and maintain your immunizations. These include:  Tetanus, diphtheria, and pertussis (Tdap) booster vaccine.  Influenza every year before the flu season begins.  Pneumonia vaccine.  Shingles vaccine. Your health care provider may also recommend other immunizations. This information is not intended to replace advice given to you by your health care provider. Make sure you discuss any questions you have with your health care provider. Document Released: 11/15/2005 Document Revised: 04/12/2016 Document Reviewed: 06/27/2015  2017 [employer]

## 2016-11-11 NOTE — Progress Notes (Signed)
Patient ID: Darlene Carpenter, female    DOB: November 22, 1945  Age: 71 y.o. MRN: [id number]  The patient is here for  management of  chronic and acute problems.  MAMMOGRAM normal  DEC 2017  COLONOSCOPY done by Dr. Kinnie Scales in [place], date unclear. 3 POLYPS  NO FALLS LATELY   Lab Results  Component Value Date   HGBA1C 7.1 (H) 08/09/2016       The risk factors are reflected in the social history..    The roster of all physicians providing medical care to patient - is listed in the Snapshot section of the chart.  Activities of daily living:  The patient is 100% independent in all ADLs: dressing, toileting, feeding as well as independent mobility  Home safety : The patient has smoke detectors in the home. They wear seatbelts.  There are no firearms at home. There is no violence in the home.   There is no risks for hepatitis, STDs or HIV. There is no   history of blood transfusion. They have no travel history to infectious disease endemic areas of the world.  The patient has seen their dentist in the last six month. They have seen their eye doctor in the last year. They admit to slight hearing difficulty with regard to whispered voices and some television programs.  They have deferred audiologic testing in the last year.  They do not  have excessive sun exposure. Discussed the need for sun protection: hats, long sleeves and use of sunscreen if there is significant sun exposure.   Diet: the importance of a healthy diet is discussed. They do have a healthy diet.  The benefits of regular aerobic exercise were discussed. She walks 4 times per week ,  20 minutes.   Depression screen: there are no signs or vegative symptoms of depression- irritability, change in appetite, anhedonia, sadness/tearfullness.  Cognitive assessment: the patient manages all their financial and personal affairs and is actively engaged. They could relate day,date,year and events; recalled 2/3 objects at 3 minutes; performed clock-face  test normally.  The following portions of the patient's history were reviewed and updated as appropriate: allergies, current medications, past family history, past medical history,  past surgical history, past social history  and problem list.  Visual acuity was not assessed per patient preference since she has regular follow up with her ophthalmologist. Hearing and body mass index were assessed and reviewed.   During the course of the visit the patient was educated and counseled about appropriate screening and preventive services including : fall prevention , diabetes screening, nutrition counseling, colorectal cancer screening, and recommended immunizations.    CC: The primary encounter diagnosis was Type 2 diabetes mellitus with stage 3 chronic kidney disease, without long-term current use of insulin (HCC). Diagnoses of Need for pneumococcal vaccination, Psychophysiological insomnia, Cytopenia, Colon cancer screening, Encounter for preventive health examination, and Postmenopausal estrogen deficiency were also pertinent to this visit.   History of CKD, LAST CR 1.8 DEC Lung transplant recipient followed [facility] Thrombocytopenia/neutropenia: last PLT count was 86k WBC 8.08 Sep 2016 SEES NEPHROLOGY AND TRANSPLANT THIS MONTH  SEES DERMATOLOGY  Darlene Carpenter AT [facility] ANNUALLY basal cell ca skin   CC: FATIGUE.  FEELS ROTTEN .  SLEEP difficulties: HAS TROUBLE  INITIATING.  READS PAPER BOOKS AND TAKES AMBIEN IMMEDIATELY BEFORE BEDTIME.  TAKES ? 1 HOUR TO FALL ASLEEP.  LOTS OF EMOTIONAL STRESSORS HUSBAND WAS RECENTLY IN HOSPITAL FOR C DIF COLITIS AND IS NOW AT HOME BUT NOT  cooperating with PT,  She is threatening to send hib to NH because she is too frail to assist him in ambulating, rising from chair, bed, etc.  PATIENT HAS MANAGED TO AVOID ILLNESS BY cleeaning all surfaces with bleACH repeatedly   Type 2 DM:  She does not check blood sugars. She is walking  EVERY DAY DAY FOR 25 -30 MINUTES.  APPETITE GOOD   BFAST: SMALL BAGEL AND COFFEE  LUNCH: 2 EGGS AND A PEPSI.  SUPPER: GRILLS OR ROASTS MEAT ,    NO REGULAR DESSERT. SNACKS ON GRAPES   History Darlene Carpenter has a past medical history of Chicken pox; Emphysema of lung (HCC); and UTI (lower urinary tract infection).   She has a past surgical history that includes Cholecystectomy; Abdominal hysterectomy; and Lung transplant, double (Bilateral, 2011).   Her family history includes Cancer in her brother; Hypertension in her father and mother.She reports that she quit smoking about 9 years ago. Her smoking use included Cigarettes. She has a 45.00 pack-year smoking history. She does not have any smokeless tobacco history on file. She reports that she does not drink alcohol or use drugs.  Outpatient Medications Prior to Visit  Medication Sig Dispense Refill  . azithromycin (ZITHROMAX) 250 MG tablet Take by mouth.    . cetirizine HCl (ZYRTEC) 5 MG/5ML SYRP Take 5 mg by mouth daily.    . furosemide (LASIX) 20 MG tablet Take 20 mg by mouth daily as needed.    . Immune Globulin 10% (GAMUNEX-C) 10 GM/100ML SOLN Inject 40 mg into the vein 3 (three) times a week.     . magnesium oxide (MAG-OX) 400 MG tablet Take 800 mg by mouth 2 (two) times daily.    . metoprolol tartrate (LOPRESSOR) 25 MG tablet Take 0.5 tablets by mouth 2 (two) times daily.    . mycophenolate (CELLCEPT) 500 MG tablet Take 250 mg by mouth 2 (two) times daily.    . ondansetron (ZOFRAN) 4 MG tablet Take 4 mg by mouth every 8 (eight) hours as needed for nausea or vomiting.    . tacrolimus (PROGRAF) 0.5 MG capsule Take 0.5 mg by mouth Nightly.    . tacrolimus (PROGRAF) 1 MG capsule Take 1 mg by mouth 2 (two) times daily.    . valGANciclovir (VALCYTE) 450 MG tablet Take 450 mg by mouth 3 (three) times a week.     . zolpidem (AMBIEN) 10 MG tablet Take 10 mg by mouth at bedtime as needed for sleep.     No facility-administered medications prior to visit.     Review of Systems   Patient denies  headache, fevers, , unintentional weight loss, skin rash, eye pain, sinus congestion and sinus pain, sore throat, dysphagia,  hemoptysis , cough, dyspnea, wheezing, chest pain, palpitations, orthopnea, edema, abdominal pain, nausea, melena, diarrhea, constipation, flank pain, dysuria, hematuria, urinary  Frequency, nocturia, numbness, tingling, seizures,  Focal weakness, Loss of consciousness,  Tremor,, depression, anxiety, and suicidal ideation.      Objective:  BP (!) 160/80 (BP Location: Left Arm, Cuff Size: Normal)   Pulse (!) 101   Temp 98.4 F (36.9 C) (Oral)   Resp 16   Ht 5\' 2"  (1.575 m)   Wt 181 lb (82.1 kg)   SpO2 95%   BMI 33.11 kg/m   Physical Exam   General appearance: alert, cooperative and appears stated age Head: Normocephalic, without obvious abnormality, atraumatic Eyes: conjunctivae/corneas clear. PERRL, EOM's intact. Fundi benign. Ears: normal TM's and external ear canals both  ears Nose: Nares normal. Septum midline. Mucosa normal. No drainage or sinus tenderness. Throat: lips, mucosa, and tongue normal; teeth and gums normal Neck: no adenopathy, no carotid bruit, no JVD, supple, symmetrical, trachea midline and thyroid not enlarged, symmetric, no tenderness/mass/nodules Lungs: clear to auscultation bilaterally Breasts: normal appearance, no masses or tenderness Heart: regular rate and rhythm, S1, S2 normal, no murmur, click, rub or gallop Abdomen: soft, non-tender; bowel sounds normal; no masses,  no organomegaly Extremities: extremities normal, atraumatic, no cyanosis or edema Pulses: 2+ and symmetric Skin: Skin color, texture, turgor normal. No rashes or lesions Neurologic: Alert and oriented X 3, normal strength and tone. Normal symmetric reflexes. Normal coordination and gait.      Assessment & Plan:   Problem List Items Addressed This Visit    Cytopenia    Her last cbc had improved.  Neutropenia resolved and platelets increased,  May both habve been  due to septra.  , Lab Results  Component Value Date   WBC 6.6 07/10/2016   HGB 12.5 07/10/2016   HCT 36 07/10/2016   PLT 70 (A) 07/10/2016         DM (diabetes mellitus), type 2 with renal complications (HCC) - Primary     well-controlled on diet alone .  hemoglobin A1c has been consistently at or  less than 7.0 . Patient is up-to-date on eye exams and foot exam is normal. Patient had  CKD and microalbuminuria is noted today . Patient is not on statin therapy for CAD risk reduction , however LDL is < 70.  She is  not taking an  ACE/ARB for reduction in proteinuria but has nephrology follow up this month .    Lab Results  Component Value Date   HGBA1C 7.1 (H) 08/09/2016   Lab Results  Component Value Date   MICROALBUR 2.1 (H) 11/11/2016   Lab Results  Component Value Date   CHOL 137 11/11/2016   HDL 45.50 11/11/2016   LDLCALC 54 11/11/2016   TRIG 186.0 (H) 11/11/2016   CHOLHDL 3 11/11/2016         Relevant Orders   Hemoglobin A1c   Lipid panel (Completed)   Microalbumin / creatinine urine ratio (Completed)   Encounter for preventive health examination    Annual comprehensive preventive exam was done as well as an evaluation and management of chronic conditions .  During the course of the visit the patient was educated and counseled about appropriate screening and preventive services including :  diabetes screening, lipid analysis with projected  10 year  risk for CAD , nutrition counseling, breast, cervical and colorectal cancer screening, and recommended immunizations.  Printed recommendations for health maintenance screenings was given      Insomnia    She remains ambien dependent for years,  And has failed multiple trials including rozerem. But per patient her other doctor discontinued this medication trial of lexapro since her insomnia appears to be due to anxiety       Other Visit Diagnoses    Need for pneumococcal vaccination       Relevant Orders   Pneumococcal  polysaccharide vaccine 23-valent greater than or equal to 2yo subcutaneous/IM (Completed)   Colon cancer screening       Relevant Orders   Ambulatory referral to Gastroenterology   Postmenopausal estrogen deficiency       Relevant Orders   DG Bone Density      I am having Ms. Yokley start on escitalopram. I am also having her  maintain her metoprolol tartrate, furosemide, magnesium oxide, ondansetron, zolpidem, cetirizine HCl, tacrolimus, tacrolimus, mycophenolate, valGANciclovir, azithromycin, Immune Globulin 10%, nystatin, and pentamidine.  Meds ordered this encounter  Medications  . nystatin (MYCOSTATIN) 100000 UNIT/ML suspension    Sig: Take 5 mLs by mouth 3 (three) times daily.  . pentamidine (NEBUPENT) 300 MG inhalation solution    Sig: Inhale 350 mg into the lungs every 30 (thirty) days.  Marland Kitchen. escitalopram (LEXAPRO) 10 MG tablet    Sig: Take 1 tablet (10 mg total) by mouth daily.    Dispense:  30 tablet    Refill:  3    There are no discontinued medications.  Follow-up: Return in about 3 months (around 02/08/2017) for follow up diabetes, 90+ DAYS FROM TODAY .   Sherlene ShamsULLO, Elijah Michaelis L, MD

## 2016-11-12 DIAGNOSIS — Z0000 Encounter for general adult medical examination without abnormal findings: Secondary | ICD-10-CM | POA: Insufficient documentation

## 2016-11-12 NOTE — Assessment & Plan Note (Addendum)
well-controlled on diet alone .  hemoglobin A1c has been consistently at or  less than 7.0 . Patient is up-to-date on eye exams and foot exam is normal. Patient had  CKD and microalbuminuria is noted today . Patient is not on statin therapy for CAD risk reduction , however LDL is < 70.  She is  not taking an  ACE/ARB for reduction in proteinuria but has nephrology follow up this month .    Lab Results  Component Value Date   HGBA1C 7.1 (H) 08/09/2016   Lab Results  Component Value Date   MICROALBUR 2.1 (H) 11/11/2016   Lab Results  Component Value Date   CHOL 137 11/11/2016   HDL 45.50 11/11/2016   LDLCALC 54 11/11/2016   TRIG 186.0 (H) 11/11/2016   CHOLHDL 3 11/11/2016

## 2016-11-12 NOTE — Assessment & Plan Note (Signed)
Her last cbc had improved.  Neutropenia resolved and platelets increased,  May both habve been due to septra.  , Lab Results  Component Value Date   WBC 6.6 07/10/2016   HGB 12.5 07/10/2016   HCT 36 07/10/2016   PLT 70 (A) 07/10/2016

## 2016-11-12 NOTE — Assessment & Plan Note (Signed)
She remains ambien dependent for years,  And has failed multiple trials including rozerem. But per patient her other doctor discontinued this medication trial of lexapro since her insomnia appears to be due to anxiety

## 2016-11-12 NOTE — Assessment & Plan Note (Signed)
Annual comprehensive preventive exam was done as well as an evaluation and management of chronic conditions .  During the course of the visit the patient was educated and counseled about appropriate screening and preventive services including :  diabetes screening, lipid analysis with projected  10 year  risk for CAD , nutrition counseling, breast, cervical and colorectal cancer screening, and recommended immunizations.  Printed recommendations for health maintenance screenings was given 

## 2016-11-20 NOTE — Progress Notes (Signed)
Ok. Pt is scheduled. Thank you! °

## 2016-11-22 ENCOUNTER — Telehealth: Payer: Self-pay | Admitting: *Deleted

## 2016-11-22 NOTE — Telephone Encounter (Signed)
Pt has requested lab results. She received result on yesterday,however she wanted to make sure she understands the results Pt contact [phone]

## 2016-11-25 NOTE — Telephone Encounter (Signed)
I called pt- she wasn't sure about what to do about her eye exam. She states she has not been seeing a eye doctor annually. I advised her to contact a eye doctor and inform them she has diabetes and they will know what exams are needed.

## 2016-11-28 ENCOUNTER — Encounter: Payer: Self-pay | Admitting: Internal Medicine

## 2016-11-29 ENCOUNTER — Encounter: Payer: Self-pay | Admitting: Internal Medicine

## 2016-11-29 ENCOUNTER — Ambulatory Visit: Payer: Medicare Other

## 2017-01-10 LAB — HM DIABETES EYE EXAM

## 2017-01-13 ENCOUNTER — Ambulatory Visit
Admission: RE | Admit: 2017-01-13 | Discharge: 2017-01-13 | Disposition: A | Discharge status: Home / Self Care | Payer: Medicare Other | Source: Ambulatory Visit | Attending: Internal Medicine | Admitting: Internal Medicine

## 2017-01-13 DIAGNOSIS — M8588 Other specified disorders of bone density and structure, other site: Secondary | ICD-10-CM | POA: Diagnosis not present

## 2017-01-13 DIAGNOSIS — Z780 Asymptomatic menopausal state: Secondary | ICD-10-CM | POA: Diagnosis not present

## 2017-01-16 ENCOUNTER — Telehealth: Payer: Self-pay

## 2017-01-16 NOTE — Telephone Encounter (Signed)
-----   Message from Sherlene Shams, MD sent at 01/16/2017  2:07 PM EDT ----- Bone Density scores received, she has osteopenia,  Moderate.  Would repeat in 2 years and consider therapy then if there is a significant change. Continue calcium, vitamin d and weight bearing exercise on a regular basis.

## 2017-01-16 NOTE — Telephone Encounter (Signed)
LMTCB

## 2017-01-23 ENCOUNTER — Ambulatory Visit (INDEPENDENT_AMBULATORY_CARE_PROVIDER_SITE_OTHER): Payer: Medicare Other

## 2017-01-23 VITALS — BP 140/68 | HR 85 | Temp 98.4°F | Resp 14 | Ht 62.0 in | Wt 179.8 lb

## 2017-01-23 DIAGNOSIS — Z0000 Encounter for general adult medical examination without abnormal findings: Secondary | ICD-10-CM | POA: Diagnosis not present

## 2017-01-23 NOTE — Progress Notes (Signed)
Subjective:   Darlene Carpenter is a 71 y.o. female who presents for an Initial Medicare Annual Wellness Visit.  Review of Systems    No ROS.  Medicare Wellness Visit.  Cardiac Risk Factors include: advanced age (>23men, >60 women);obesity (BMI >30kg/m2);diabetes mellitus     Objective:    Today's Vitals   01/23/17 1255  BP: 140/68  Pulse: 85  Resp: 14  Temp: 98.4 F (36.9 C)  TempSrc: Oral  SpO2: 97%  Weight: 179 lb 12.8 oz (81.6 kg)  Height:  (1.575 m)   Body mass index is 32.89 kg/m.   Current Medications (verified) Outpatient Encounter Prescriptions as of 01/23/2017  Medication Sig  . azithromycin (ZITHROMAX) 250 MG tablet Take by mouth.  . cetirizine HCl (ZYRTEC) 5 MG/5ML SYRP Take 5 mg by mouth daily.  Marland Kitchen escitalopram (LEXAPRO) 10 MG tablet Take 1 tablet (10 mg total) by mouth daily.  . furosemide (LASIX) 20 MG tablet Take 20 mg by mouth daily as needed.  . Immune Globulin 10% (GAMUNEX-C) 10 GM/100ML SOLN Inject 40 mg into the vein 3 (three) times a week.   . magnesium oxide (MAG-OX) 400 MG tablet Take 800 mg by mouth 2 (two) times daily.  . metoprolol tartrate (LOPRESSOR) 25 MG tablet Take 1 tablet by mouth 2 (two) times daily.   . mycophenolate (CELLCEPT) 500 MG tablet Take 250 mg by mouth 2 (two) times daily.  Marland Kitchen nystatin (MYCOSTATIN) 100000 UNIT/ML suspension Take 5 mLs by mouth 3 (three) times daily.  . ondansetron (ZOFRAN) 4 MG tablet Take 4 mg by mouth every 8 (eight) hours as needed for nausea or vomiting.  . pentamidine (NEBUPENT) 300 MG inhalation solution Inhale 350 mg into the lungs every 30 (thirty) days.  Marland Kitchen tacrolimus (PROGRAF) 0.5 MG capsule Take 0.5 mg by mouth Nightly.  . tacrolimus (PROGRAF) 1 MG capsule Take 1.5 mg by mouth 2 (two) times daily.   . valGANciclovir (VALCYTE) 450 MG tablet Take 450 mg by mouth 3 (three) times a week.   . zolpidem (AMBIEN) 10 MG tablet Take 10 mg by mouth at bedtime as needed for sleep.   No facility-administered  encounter medications on file as of 01/23/2017.     Allergies (verified) Shellfish allergy   History: Past Medical History:  Diagnosis Date  . Chicken pox   . Emphysema of lung (HCC)   . UTI (lower urinary tract infection)    Past Surgical History:  Procedure Laterality Date  . ABDOMINAL HYSTERECTOMY    . CHOLECYSTECTOMY    . LUNG TRANSPLANT, DOUBLE Bilateral 2011   Family History  Problem Relation Age of Onset  . Hypertension Mother   . Hypertension Father   . Cancer Brother   . Diabetes Brother   . Heart disease Brother     Visual merchandiser   Social History   Occupational History  . Not on file.   Social History Main Topics  . Smoking status: Former Smoker    Packs/day: 1.00    Years: 45.00    Types: Cigarettes    Quit date: 11/26/2006  . Smokeless tobacco: Never Used  . Alcohol use No  . Drug use: No  . Sexual activity: No    Tobacco Counseling Counseling given: No   Activities of Daily Living In your present state of health, do you have any difficulty performing the following activities: 01/23/2017  Hearing? Y  Vision? N  Difficulty concentrating or making decisions? N  Walking or climbing stairs? Jeannie Fend  Dressing or bathing? N  Doing errands, shopping? N  Preparing Food and eating ? N  Using the Toilet? N  In the past six months, have you accidently leaked urine? N  Do you have problems with loss of bowel control? N  Managing your Medications? N  Managing your Finances? N  Housekeeping or managing your Housekeeping? N  Some recent data might be hidden    Immunizations and Health Maintenance Immunization History  Administered Date(s) Administered  . Influenza Split 08/26/2013  . Influenza-Unspecified 05/09/2016  . Pneumococcal Conjugate-13 08/10/2012  . Pneumococcal Polysaccharide-23 11/26/2009, 11/11/2016   Health Maintenance Due  Topic Date Due  . Hepatitis C Screening  [birth year]  . COLONOSCOPY  06/17/1995    Patient Care Team: Sherlene Shams, MD as PCP - General (Internal Medicine)  Indicate any recent Medical Services you may have received from other than [facility] providers in the past year (date may be approximate).     Assessment:   This is a routine wellness examination for Darlene Carpenter. The goal of the wellness visit is to assist the patient how to close the gaps in care and create a preventative care plan for the patient.   Osteoporosis risk reviewed.  Medications reviewed; taking without issues or barriers.  Safety issues reviewed; smoke detectors in the home. No firearms in the home.  Wears seatbelts when driving or riding with others. Patient does wear sunscreen or protective clothing when in direct sunlight. No violence in the home.  Patient is alert, normal appearance, oriented to person/place/and time. Correctly identified the president of the [place], recall of 3/3 words, and performing simple calculations.  Patient displays appropriate judgement and can read correct time from watch face.  No new identified risk were noted.  No failures at ADL's or IADL's.   BMI- discussed the importance of a healthy diet, water intake and exercise. Educational material provided.   24 hour diet recall: Breakfast: mini bagel Lunch: 2 boiled eggs Dinner: Deli sandwich  Daily fluid intake: 2 cups of caffeine, 4 cups of water  HTN- followed by PCP.  Dental- every 12 months.  Eye- Visual acuity not assessed per patient preference since they have regular follow up with the ophthalmologist.  Wears corrective lenses.  Sleep patterns- Sleeps 6 hours at night.  Wakes feeling rested. CPAP in use.  Hepatitis C Screening discussed, educational material provided.  TDAP vaccine deferred per patient preference.  Follow up with insurance.  Educational material provided.  Colonoscopy discussed.  Declined. She plans to follow up with PCP after scheduled cataract surgery in June 2018.  Educational material provided.  Patient Concerns:  None at this time. Follow up with PCP as needed.  Hearing/Vision screen Hearing Screening Comments: Patient has difficulty hearing conversational tones.  Audiology testing deferred in the last year per patient preference.    Vision Screening Comments: Followed by [facility] Wears corrective lenses No retinopathy reported Last OV 01/2017 Cataract extraction scheduled 03/2017 Visual acuity not assessed per patient preference since they have regular follow up with the ophthalmologist  Dietary issues and exercise activities discussed: Current Exercise Habits: Home exercise routine, Type of exercise: walking, Time (Minutes): 30, Frequency (Times/Week): 7, Weekly Exercise (Minutes/Week): 210, Intensity: Mild  Goals    . Healthy Lifestyle          Stay active and continue waking for exercise Stay hydrated, increase water intake Low carb foods      Depression Screen PHQ 2/9 Scores 01/23/2017 11/11/2016  PHQ -  2 Score 0 0    Fall Risk Fall Risk  01/23/2017 11/11/2016  Falls in the past year? No No    Cognitive Function: MMSE - Mini Mental State Exam 01/23/2017  Orientation to time 5  Orientation to Place 5  Registration 3  Attention/ Calculation 5  Recall 3  Language- name 2 objects 2  Language- repeat 1  Language- follow 3 step command 3  Language- read & follow direction 1  Write a sentence 1  Copy design 1  Total score 30        Screening Tests Health Maintenance  Topic Date Due  . Hepatitis C Screening  June 08, 1946  . COLONOSCOPY  06/17/1995  . TETANUS/TDAP  02/03/2018 (Originally 06/16/1965)  . HEMOGLOBIN A1C  02/06/2017  . INFLUENZA VACCINE  05/07/2017  . URINE MICROALBUMIN  11/11/2017  . FOOT EXAM  01/06/2018  . OPHTHALMOLOGY EXAM  01/10/2018  . MAMMOGRAM  10/01/2018  . DEXA SCAN  Completed  . PNA vac Low Risk Adult  Completed      Plan:    End of life planning; Advance aging; Advanced directives discussed. Copy of current HCPOA/Living Will  requested.    Medicare Attestation I have personally reviewed: The patient's medical and social history Their use of alcohol, tobacco or illicit drugs Their current medications and supplements The patient's functional ability including ADLs,fall risks, home safety risks, cognitive, and hearing and visual impairment Diet and physical activities Evidence for depression   The patient's weight, height, BMI, and visual acuity have been recorded in the chart.  I have made referrals and provided education to the patient based on review of the above and I have provided the patient with a written personalized care plan for preventive services.    During the course of the visit, Cyntha was educated and counseled about the following appropriate screening and preventive services:   Vaccines to include Pneumoccal, Influenza, Hepatitis B, Td, Zostavax, HCV  Colorectal cancer screening-  Bone density screening-UTD  Diabetes-followed by PCP  Glaucoma screening-annual eye exam  Mammography-UTD  Nutrition counseling-diabetic  Patient Instructions (the written plan) were given to the patient.    Ashok Pall, LPN   6/44/[id number]

## 2017-01-23 NOTE — Patient Instructions (Addendum)
  Darlene Carpenter , Thank you for taking time to come for your Medicare Wellness Visit. I appreciate your ongoing commitment to your health goals. Please review the following plan we discussed and let me know if I can assist you in the future.   Follow up with Dr. Darrick Huntsman as needed.    Bring a copy of your Health Care Power of Attorney and/or Living Will to be scanned into chart.  Have a great day!  These are the goals we discussed: Goals    . Healthy Lifestyle          Stay active and continue waking for exercise Stay hydrated, increase water intake Low carb foods       This is a list of the screening recommended for you and due dates:  Health Maintenance  Topic Date Due  .  Hepatitis C: One time screening is recommended by Center for Disease Control  (CDC) for  adults born from [birth year] through 1966.   1946-06-17  . Colon Cancer Screening  06/17/1995  . Tetanus Vaccine  02/03/2018*  . Hemoglobin A1C  02/06/2017  . Flu Shot  05/07/2017  . Urine Protein Check  11/11/2017  . Complete foot exam   01/06/2018  . Eye exam for diabetics  01/10/2018  . Mammogram  10/01/2018  . DEXA scan (bone density measurement)  Completed  . Pneumonia vaccines  Completed  *Topic was postponed. The date shown is not the original due date.

## 2017-01-26 NOTE — Progress Notes (Signed)
  I have reviewed the above information and agree with above.   Teresa Tullo, MD 

## 2017-01-28 ENCOUNTER — Encounter: Payer: Self-pay | Admitting: *Deleted

## 2017-01-28 ENCOUNTER — Encounter: Payer: Medicare Other | Attending: Internal Medicine | Admitting: *Deleted

## 2017-01-28 VITALS — BP 140/74 | Ht 62.0 in | Wt 178.8 lb

## 2017-01-28 DIAGNOSIS — T380X5A Adverse effect of glucocorticoids and synthetic analogues, initial encounter: Secondary | ICD-10-CM | POA: Diagnosis not present

## 2017-01-28 DIAGNOSIS — E099 Drug or chemical induced diabetes mellitus without complications: Secondary | ICD-10-CM | POA: Diagnosis not present

## 2017-01-28 DIAGNOSIS — E0965 Drug or chemical induced diabetes mellitus with hyperglycemia: Secondary | ICD-10-CM

## 2017-01-28 DIAGNOSIS — Z713 Dietary counseling and surveillance: Secondary | ICD-10-CM | POA: Diagnosis not present

## 2017-01-28 NOTE — Progress Notes (Signed)
Diabetes Self-Management Education  Visit Type: First/Initial  Appt. Start Time: 1340 Appt. End Time: 6226  01/28/2017  Darlene Carpenter, identified by name and date of birth, is a 71 y.o. female with a diagnosis of Diabetes: Type 2.   ASSESSMENT  Blood pressure 140/74, height _0  (1.575 m), weight 178 lb 12.8 oz (81.1 kg). Body mass index is 32.7 kg/m.      Diabetes Self-Management Education - 01/28/17 1511      Visit Information   Visit Type First/Initial     Initial Visit   Diabetes Type Type 2   Are you currently following a meal plan? No   Are you taking your medications as prescribed? Yes   Date Diagnosed 2011 She was on insulin after transplant but was able to come off and control with diet.      Health Coping   How would you rate your overall health? Good     Psychosocial Assessment   Patient Belief/Attitude about Diabetes Other (comment)  "tired"   Self-care barriers None   Self-management support Doctor's office   Patient Concerns Nutrition/Meal planning;Glycemic Control;Weight Control;Healthy Lifestyle   Special Needs None   Preferred Learning Style Visual   Learning Readiness Ready   How often do you need to have someone help you when you read instructions, pamphlets, or other written materials from your doctor or pharmacy? 1 - Never   What is the last grade level you completed in school? some college     Pre-Education Assessment   Patient understands the diabetes disease and treatment process. Needs Instruction   Patient understands incorporating nutritional management into lifestyle. Needs Instruction   Patient undertands incorporating physical activity into lifestyle. Needs Review   Patient understands using medications safely. Needs Instruction   Patient understands monitoring blood glucose, interpreting and using results Needs Review   Patient understands prevention, detection, and treatment of acute complications. Needs Instruction   Patient  understands prevention, detection, and treatment of chronic complications. Needs Review   Patient understands how to develop strategies to address psychosocial issues. Needs Instruction   Patient understands how to develop strategies to promote health/change behavior. Needs Instruction     Complications   Last HgB A1C per patient/outside source 7.9 %  11/29/16   How often do you check your blood sugar? 1-2 times/day   Fasting Blood glucose range (mg/dL) 130-179  Pt reports FBG's 160's mg/dL.    Postprandial Blood glucose range (mg/dL) 180-200;>200  Pt reports pp lunch readings of 200-300's mg/dL.    Have you had a dilated eye exam in the past 12 months? Yes   Have you had a dental exam in the past 12 months? No   Are you checking your feet? Yes   How many days per week are you checking your feet? 7     Dietary Intake   Breakfast mini bagel, cereal and milk, egg and bacon, sausage and egg biscuit   Snack (morning) grapes   Lunch eggs, roast beef sandwich with 1 piece of bread, mac-n-cheese   Snack (afternoon) grapes   Dinner ribs, rish, cihcken, stew beef with potatoes, corn, rice, black beans   Snack (evening) pear, grapes   Beverage(s) water, cofffee with little cream, regular soda, occasional sweet tea     Exercise   Exercise Type Light (walking / raking leaves)   How many days per week to you exercise? 7   How many minutes per day do you exercise? 25   Total minutes  per week of exercise 175     Patient Education   Previous Diabetes Education Yes (please comment)  1 session at [facility] after bil lung transplant   Disease state  Definition of diabetes, type 1 and 2, and the diagnosis of diabetes;Factors that contribute to the development of diabetes;Explored patient's options for treatment of their diabetes   Nutrition management  Role of diet in the treatment of diabetes and the relationship between the three main macronutrients and blood glucose level;Food label reading, portion  sizes and measuring food.;Reviewed blood glucose goals for pre and post meals and how to evaluate the patients' food intake on their blood glucose level.   Physical activity and exercise  Role of exercise on diabetes management, blood pressure control and cardiac health.   Monitoring Purpose and frequency of SMBG.;Taught/discussed recording of test results and interpretation of SMBG.;Identified appropriate SMBG and/or A1C goals.   Chronic complications Relationship between chronic complications and blood glucose control   Psychosocial adjustment Identified and addressed patients feelings and concerns about diabetes;Role of stress on diabetes     Individualized Goals (developed by patient)   Reducing Risk Improve blood sugars Lose weight Lead a healthier lifestyle     Outcomes   Expected Outcomes Demonstrated interest in learning. Expect positive outcomes   Future DMSE 4-6 wks      Individualized Plan for Diabetes Self-Management Training:   Learning Objective:  Patient will have a greater understanding of diabetes self-management. Patient education plan is to attend individual and/or group sessions per assessed needs and concerns.   Plan:   Patient Instructions  Check blood sugars 2 x day before breakfast and 2 hrs after supper every day Exercise: Continue walking  for  25  minutes   7  days a week Eat 3 meals day,  1-2 snacks a day Space meals 4-6 hours apart Avoid sugar sweetened drinks (soda, tea)  Allow 2-3 hours between eating meals and snacks Bring blood sugar records to the next class  Expected Outcomes:  Demonstrated interest in learning. Expect positive outcomes  Education material provided:  General Meal Planning Guidelines Simple Meal Plan  If problems or questions, patient to contact team via:  Johny Drilling, RN, CCM, CDE [phone]  Future DSME appointment: 4-6 wks  Feb 27, 2017 for Diabetes Class 1

## 2017-01-28 NOTE — Patient Instructions (Signed)
Check blood sugars 2 x day before breakfast and 2 hrs after supper every day  Exercise: Continue walking  for  25  minutes   7  days a week  Eat 3 meals day,  1-2 snacks a day Space meals 4-6 hours apart Avoid sugar sweetened drinks (soda, tea)  Allow 2-3 hours between eating meals and snacks  Bring blood sugar records to the next class  Return for classes on:

## 2017-02-27 ENCOUNTER — Ambulatory Visit: Payer: Medicare Other

## 2017-03-06 ENCOUNTER — Ambulatory Visit: Payer: Medicare Other

## 2017-03-13 ENCOUNTER — Ambulatory Visit: Payer: Medicare Other

## 2017-03-17 ENCOUNTER — Encounter: Payer: Self-pay | Admitting: *Deleted

## 2017-03-20 ENCOUNTER — Ambulatory Visit: Payer: Medicare Other | Admitting: Anesthesiology

## 2017-03-20 ENCOUNTER — Encounter
Admission: RE | Disposition: A | Discharge status: Home / Self Care | Payer: Self-pay | Source: Ambulatory Visit | Attending: Ophthalmology

## 2017-03-20 ENCOUNTER — Ambulatory Visit
Admission: RE | Admit: 2017-03-20 | Discharge: 2017-03-20 | Disposition: A | Discharge status: Home / Self Care | Payer: Medicare Other | Source: Ambulatory Visit | Attending: Ophthalmology | Admitting: Ophthalmology

## 2017-03-20 ENCOUNTER — Encounter: Payer: Self-pay | Admitting: *Deleted

## 2017-03-20 DIAGNOSIS — G4730 Sleep apnea, unspecified: Secondary | ICD-10-CM | POA: Insufficient documentation

## 2017-03-20 DIAGNOSIS — Z942 Lung transplant status: Secondary | ICD-10-CM | POA: Insufficient documentation

## 2017-03-20 DIAGNOSIS — J439 Emphysema, unspecified: Secondary | ICD-10-CM | POA: Diagnosis not present

## 2017-03-20 DIAGNOSIS — Z79899 Other long term (current) drug therapy: Secondary | ICD-10-CM | POA: Insufficient documentation

## 2017-03-20 DIAGNOSIS — I10 Essential (primary) hypertension: Secondary | ICD-10-CM | POA: Insufficient documentation

## 2017-03-20 DIAGNOSIS — E1151 Type 2 diabetes mellitus with diabetic peripheral angiopathy without gangrene: Secondary | ICD-10-CM | POA: Diagnosis not present

## 2017-03-20 DIAGNOSIS — Z7952 Long term (current) use of systemic steroids: Secondary | ICD-10-CM | POA: Diagnosis not present

## 2017-03-20 DIAGNOSIS — H2511 Age-related nuclear cataract, right eye: Secondary | ICD-10-CM | POA: Insufficient documentation

## 2017-03-20 DIAGNOSIS — Z87891 Personal history of nicotine dependence: Secondary | ICD-10-CM | POA: Insufficient documentation

## 2017-03-20 HISTORY — DX: Adverse effect of unspecified anesthetic, initial encounter: T41.45XA

## 2017-03-20 HISTORY — DX: Other complications of anesthesia, initial encounter: T88.59XA

## 2017-03-20 HISTORY — PX: CATARACT EXTRACTION W/PHACO: SHX586

## 2017-03-20 LAB — GLUCOSE, CAPILLARY: Glucose-Capillary: 143 mg/dL — ABNORMAL HIGH (ref 65–99)

## 2017-03-20 SURGERY — PHACOEMULSIFICATION, CATARACT, WITH IOL INSERTION
Anesthesia: Monitor Anesthesia Care | Site: Eye | Laterality: Right | Wound class: Clean

## 2017-03-20 MED ORDER — MOXIFLOXACIN HCL 0.5 % OP SOLN
OPHTHALMIC | Status: DC | PRN
  Administered 2017-03-20: 1 [drp] via OPHTHALMIC

## 2017-03-20 MED ORDER — LIDOCAINE HCL (PF) 4 % IJ SOLN
INTRAOCULAR | Status: DC | PRN
  Administered 2017-03-20: 4 mL via OPHTHALMIC

## 2017-03-20 MED ORDER — POVIDONE-IODINE 5 % OP SOLN
OPHTHALMIC | Status: DC | PRN
  Administered 2017-03-20: 1 via OPHTHALMIC

## 2017-03-20 MED ORDER — MIDAZOLAM HCL 2 MG/2ML IJ SOLN
INTRAMUSCULAR | Status: AC
  Filled 2017-03-20: qty 2

## 2017-03-20 MED ORDER — SODIUM HYALURONATE 23 MG/ML IO SOLN
INTRAOCULAR | Status: AC
  Filled 2017-03-20: qty 0.6

## 2017-03-20 MED ORDER — MIDAZOLAM HCL 5 MG/5ML IJ SOLN
INTRAMUSCULAR | Status: DC | PRN
  Administered 2017-03-20: 1 mg via INTRAVENOUS

## 2017-03-20 MED ORDER — MOXIFLOXACIN HCL 0.5 % OP SOLN: 1.0000 [drp] | OPHTHALMIC | Status: DC | PRN

## 2017-03-20 MED ORDER — ONDANSETRON HCL 4 MG/2ML IJ SOLN: 4.0000 mg | Freq: Once | INTRAMUSCULAR | Status: DC | PRN

## 2017-03-20 MED ORDER — SODIUM CHLORIDE 0.9 % IV SOLN
INTRAVENOUS | Status: DC
  Administered 2017-03-20: 11:00:00 via INTRAVENOUS
  Administered 2017-03-20: 20 mL/h via INTRAVENOUS

## 2017-03-20 MED ORDER — FENTANYL CITRATE (PF) 100 MCG/2ML IJ SOLN
25.0000 ug | INTRAMUSCULAR | Status: DC | PRN
Start: 2017-03-20 — End: 2017-03-20

## 2017-03-20 MED ORDER — SODIUM HYALURONATE 10 MG/ML IO SOLN
INTRAOCULAR | Status: AC
  Filled 2017-03-20: qty 0.85

## 2017-03-20 MED ORDER — POVIDONE-IODINE 5 % OP SOLN
OPHTHALMIC | Status: AC
  Filled 2017-03-20: qty 30

## 2017-03-20 MED ORDER — ARMC OPHTHALMIC DILATING DROPS
1.0000 "application " | OPHTHALMIC | Status: AC
  Administered 2017-03-20 (×3): 1 via OPHTHALMIC

## 2017-03-20 MED ORDER — BSS IO SOLN
INTRAOCULAR | Status: DC | PRN
  Administered 2017-03-20: 200 mL via OPHTHALMIC

## 2017-03-20 MED ORDER — SODIUM HYALURONATE 23 MG/ML IO SOLN
INTRAOCULAR | Status: DC | PRN
  Administered 2017-03-20: 0.6 mL via INTRAOCULAR

## 2017-03-20 MED ORDER — FENTANYL CITRATE (PF) 100 MCG/2ML IJ SOLN: 25.0000 ug | INTRAMUSCULAR | Status: DC | PRN

## 2017-03-20 MED ORDER — SODIUM HYALURONATE 10 MG/ML IO SOLN
INTRAOCULAR | Status: DC | PRN
  Administered 2017-03-20: 0.85 mL via INTRAOCULAR

## 2017-03-20 MED ORDER — EPINEPHRINE PF 1 MG/ML IJ SOLN
INTRAMUSCULAR | Status: AC
  Filled 2017-03-20: qty 1

## 2017-03-20 SURGICAL SUPPLY — 17 items
DISSECTOR HYDRO NUCLEUS 50X22 (MISCELLANEOUS) ×3 IMPLANT
GLOVE BIO SURGEON STRL SZ8 (GLOVE) ×3 IMPLANT
GLOVE BIOGEL M 6.5 STRL (GLOVE) ×3 IMPLANT
GLOVE SURG LX 7.5 STRW (GLOVE) ×2
GLOVE SURG LX STRL 7.5 STRW (GLOVE) ×1 IMPLANT
GOWN STRL REUS W/ TWL LRG LVL3 (GOWN DISPOSABLE) ×2 IMPLANT
GOWN STRL REUS W/TWL LRG LVL3 (GOWN DISPOSABLE) ×4
LENS IOL TECNIS ITEC 23.0 (Intraocular Lens) ×3 IMPLANT
PACK CATARACT (MISCELLANEOUS) ×3 IMPLANT
PACK CATARACT KING (MISCELLANEOUS) ×3 IMPLANT
PACK EYE AFTER SURG (MISCELLANEOUS) ×3 IMPLANT
SOL BAL SALT 15ML (MISCELLANEOUS) ×3
SOL BSS BAG (MISCELLANEOUS) ×3
SOLUTION BAL SALT 15ML (MISCELLANEOUS) ×1 IMPLANT
SOLUTION BSS BAG (MISCELLANEOUS) ×1 IMPLANT
WATER STERILE IRR 250ML POUR (IV SOLUTION) ×3 IMPLANT
WIPE NON LINTING 3.25X3.25 (MISCELLANEOUS) ×3 IMPLANT

## 2017-03-20 NOTE — H&P (Signed)
The History and Physical notes are on paper, have been signed, and are to be scanned.   I have examined the patient and there are no changes to the H&P.   Willey BladeBradley King 03/20/2017 10:34 AM

## 2017-03-20 NOTE — Anesthesia Preprocedure Evaluation (Addendum)
Anesthesia Evaluation  Patient identified by MRN, date of birth, ID band Patient awake    Reviewed: Allergy & Precautions, NPO status , Patient's Chart, lab work & pertinent test results, reviewed documented beta blocker date and time   History of Anesthesia Complications (+) PONV and history of anesthetic complications  Airway Mallampati: II  TM Distance: <3 FB     Dental  (+) Upper Dentures   Pulmonary sleep apnea , COPD,  COPD inhaler, former smoker,    Pulmonary exam normal        Cardiovascular hypertension, Pt. on medications and Pt. on home beta blockers + Peripheral Vascular Disease  Normal cardiovascular exam     Neuro/Psych negative neurological ROS  negative psych ROS   GI/Hepatic negative GI ROS,   Endo/Other  diabetes, Type 2  Renal/GU Renal InsufficiencyRenal disease     Musculoskeletal negative musculoskeletal ROS (+)   Abdominal Normal abdominal exam  (+)   Peds  Hematology negative hematology ROS (+)   Anesthesia Other Findings Past Medical History: No date: Chicken pox No date: CMV (cytomegalovirus infection) (HCC) No date: Complication of anesthesia     Comment: nausea and vomiting No date: Diabetes mellitus without complication (HCC) No date: Emphysema of lung (HCC) No date: Sleep apnea No date: UTI (lower urinary tract infection)   Doubl lung transplant per patient  Reproductive/Obstetrics                           Anesthesia Physical Anesthesia Plan  ASA: III  Anesthesia Plan: MAC   Post-op Pain Management:    Induction: Intravenous  PONV Risk Score and Plan:   Airway Management Planned: Nasal Cannula  Additional Equipment:   Intra-op Plan:   Post-operative Plan:   Informed Consent: I have reviewed the patients History and Physical, chart, labs and discussed the procedure including the risks, benefits and alternatives for the proposed anesthesia  with the patient or authorized representative who has indicated his/her understanding and acceptance.   Dental advisory given  Plan Discussed with: CRNA and Surgeon  Anesthesia Plan Comments:         Anesthesia Quick Evaluation

## 2017-03-20 NOTE — Anesthesia Post-op Follow-up Note (Cosign Needed)
Anesthesia QCDR form completed.        

## 2017-03-20 NOTE — Transfer of Care (Signed)
Immediate Anesthesia Transfer of Care Note  Patient: Darlene Carpenter  Procedure(s) Performed: Procedure(s) with comments: CATARACT EXTRACTION PHACO AND INTRAOCULAR LENS PLACEMENT (IOC) (Right) - [place]  00:26.5 AP8.8 CDE2.33 FLUID LOT #[id number]   Patient Location: PACU  Anesthesia Type:MAC  Level of Consciousness: awake, alert , oriented and patient cooperative  Airway & Oxygen Therapy: Patient Spontanous Breathing  Post-op Assessment: Report given to RN, Post -op Vital signs reviewed and stable and Patient moving all extremities X 4  Post vital signs: Reviewed and stable  Last Vitals:  Vitals:   03/20/17 1107 03/20/17 1111  BP: 140/67 140/67  Pulse: 70 68  Resp: 18 16  Temp: 36.4 C 36.4 C    Last Pain:  Vitals:   03/20/17 0955  TempSrc: Tympanic      Patients Stated Pain Goal: 0 (12/50/87 1994)  Complications: No apparent anesthesia complications

## 2017-03-20 NOTE — Op Note (Signed)
OPERATIVE NOTE  Darlene Carpenter [id number] 03/20/2017   PREOPERATIVE DIAGNOSIS:  Nuclear sclerotic cataract right eye.  H25.11   POSTOPERATIVE DIAGNOSIS:    Nuclear sclerotic cataract right eye.     PROCEDURE:  Phacoemusification with posterior chamber intraocular lens placement of the right eye   LENS:   Implant Name Type Inv. Item Serial No. Manufacturer Lot No. LRB No. Used  LENS IOL DIOP 23.0 - [id number] Intraocular Lens LENS IOL DIOP 23.0 [id number] AMO   Right 1       PCB00 +23.0   ULTRASOUND TIME: 0 minutes 26 seconds.  CDE 2.33   SURGEON:  Willey BladeBradley Laddie Naeem, MD, MPH  ANESTHESIOLOGIST: Anesthesiologist: Yves Dillarroll, Paul, MD CRNA: Michaele OfferSavage, Kasey, CRNA   ANESTHESIA:  Topical with tetracaine drops augmented with 1% preservative-free intracameral lidocaine.  ESTIMATED BLOOD LOSS: less than 1 mL.   COMPLICATIONS:  None.   DESCRIPTION OF PROCEDURE:  The patient was identified in the holding room and transported to the operating room and placed in the supine position under the operating microscope.  The right eye was identified as the operative eye and it was prepped and draped in the usual sterile ophthalmic fashion.   A 1.0 millimeter clear-corneal paracentesis was made at the 10:30 position. 0.5 ml of preservative-free 1% lidocaine with epinephrine was injected into the anterior chamber.  The anterior chamber was filled with Healon 5 viscoelastic.  A 2.4 millimeter keratome was used to make a near-clear corneal incision at the 8:00 position.  A curvilinear capsulorrhexis was made with a cystotome and capsulorrhexis forceps.  Balanced salt solution was used to hydrodissect and hydrodelineate the nucleus.   Phacoemulsification was then used in stop and chop fashion to remove the lens nucleus and epinucleus.  The remaining cortex was then removed using the irrigation and aspiration handpiece. Healon was then placed into the capsular bag to distend it for lens placement.  A lens was then  injected into the capsular bag.  The remaining viscoelastic was aspirated.   Wounds were hydrated with balanced salt solution.  The anterior chamber was inflated to a physiologic pressure with balanced salt solution.   Intracameral vigamox 0.1 mL undiluted was injected into the eye and a drop placed onto the ocular surface.  No wound leaks were noted.  The patient was taken to the recovery room in stable condition without complications of anesthesia or surgery  Willey BladeBradley Dionicio Shelnutt 03/20/2017, 11:08 AM

## 2017-03-20 NOTE — Anesthesia Postprocedure Evaluation (Signed)
Anesthesia Post Note  Patient: Darlene Carpenter  Procedure(s) Performed: Procedure(s) (LRB): CATARACT EXTRACTION PHACO AND INTRAOCULAR LENS PLACEMENT (IOC) (Right)  Patient location during evaluation: PACU Anesthesia Type: MAC Level of consciousness: awake and alert Pain management: pain level controlled Vital Signs Assessment: post-procedure vital signs reviewed and stable Respiratory status: spontaneous breathing, nonlabored ventilation and respiratory function stable Cardiovascular status: stable and blood pressure returned to baseline Anesthetic complications: no     Last Vitals:  Vitals:   03/20/17 1107 03/20/17 1111  BP: 140/67 140/67  Pulse: 70 68  Resp: 18 16  Temp: 36.4 C 36.4 C    Last Pain:  Vitals:   03/20/17 0955  TempSrc: Tympanic                 Silvana Newness A

## 2017-03-20 NOTE — Discharge Instructions (Signed)
Eye Surgery Discharge Instructions  Expect mild scratchy sensation or mild soreness. DO NOT RUB YOUR EYE!  The day of surgery:  Minimal physical activity, but bed rest is not required  No reading, computer work, or close hand work  No bending, lifting, or straining.  May watch TV  For 24 hours:  No driving, legal decisions, or alcoholic beverages  Safety precautions  Eat anything you prefer: It is better to start with liquids, then soup then solid foods.  _____ Eye patch should be worn until postoperative exam tomorrow.  ____ Solar shield eyeglasses should be worn for comfort in the sunlight/patch while sleeping  Resume all regular medications including aspirin or Coumadin if these were discontinued prior to surgery. You may shower, bathe, shave, or wash your hair. Tylenol may be taken for mild discomfort.  Call your doctor if you experience significant pain, nausea, or vomiting, fever > 101 or other signs of infection. [phone] or [phone] Specific instructions:  Follow-up Information    [place] CraneKing, Bradley Mark, MD Follow up.   Specialty:  Ophthalmology Why:  June 15 [facility] 9:45am [facility] Contact information: [address] [place] [address] [phone]

## 2017-04-17 ENCOUNTER — Ambulatory Visit: Payer: Medicare Other

## 2017-04-24 ENCOUNTER — Ambulatory Visit: Payer: Medicare Other

## 2017-05-01 ENCOUNTER — Ambulatory Visit: Payer: Medicare Other

## 2017-05-06 ENCOUNTER — Encounter: Payer: Self-pay | Admitting: *Deleted

## 2017-06-26 ENCOUNTER — Other Ambulatory Visit: Payer: Self-pay | Admitting: Internal Medicine

## 2017-07-01 ENCOUNTER — Encounter: Payer: Self-pay | Admitting: *Deleted

## 2017-07-10 ENCOUNTER — Encounter: Payer: Self-pay | Admitting: *Deleted

## 2017-07-10 ENCOUNTER — Encounter
Admission: RE | Disposition: A | Discharge status: Home / Self Care | Payer: Self-pay | Source: Ambulatory Visit | Attending: Ophthalmology

## 2017-07-10 ENCOUNTER — Ambulatory Visit: Payer: Medicare Other | Admitting: Anesthesiology

## 2017-07-10 ENCOUNTER — Ambulatory Visit
Admission: RE | Admit: 2017-07-10 | Discharge: 2017-07-10 | Disposition: A | Discharge status: Home / Self Care | Payer: Medicare Other | Source: Ambulatory Visit | Attending: Ophthalmology | Admitting: Ophthalmology

## 2017-07-10 DIAGNOSIS — Z942 Lung transplant status: Secondary | ICD-10-CM | POA: Diagnosis not present

## 2017-07-10 DIAGNOSIS — Z87891 Personal history of nicotine dependence: Secondary | ICD-10-CM | POA: Insufficient documentation

## 2017-07-10 DIAGNOSIS — D803 Selective deficiency of immunoglobulin G [IgG] subclasses: Secondary | ICD-10-CM

## 2017-07-10 DIAGNOSIS — Z90710 Acquired absence of both cervix and uterus: Secondary | ICD-10-CM | POA: Insufficient documentation

## 2017-07-10 DIAGNOSIS — Z9989 Dependence on other enabling machines and devices: Secondary | ICD-10-CM | POA: Diagnosis not present

## 2017-07-10 DIAGNOSIS — H2512 Age-related nuclear cataract, left eye: Secondary | ICD-10-CM | POA: Diagnosis present

## 2017-07-10 DIAGNOSIS — E1151 Type 2 diabetes mellitus with diabetic peripheral angiopathy without gangrene: Secondary | ICD-10-CM | POA: Insufficient documentation

## 2017-07-10 DIAGNOSIS — J449 Chronic obstructive pulmonary disease, unspecified: Secondary | ICD-10-CM | POA: Diagnosis not present

## 2017-07-10 DIAGNOSIS — I10 Essential (primary) hypertension: Secondary | ICD-10-CM | POA: Insufficient documentation

## 2017-07-10 DIAGNOSIS — G4730 Sleep apnea, unspecified: Secondary | ICD-10-CM | POA: Diagnosis not present

## 2017-07-10 HISTORY — PX: CATARACT EXTRACTION W/PHACO: SHX586

## 2017-07-10 LAB — GLUCOSE, CAPILLARY: GLUCOSE-CAPILLARY: 127 mg/dL — AB (ref 65–99)

## 2017-07-10 SURGERY — PHACOEMULSIFICATION, CATARACT, WITH IOL INSERTION
Anesthesia: Monitor Anesthesia Care | Site: Eye | Laterality: Left | Wound class: Clean

## 2017-07-10 MED ORDER — MIDAZOLAM HCL 2 MG/2ML IJ SOLN
INTRAMUSCULAR | Status: AC
  Filled 2017-07-10: qty 2

## 2017-07-10 MED ORDER — FENTANYL CITRATE (PF) 100 MCG/2ML IJ SOLN
INTRAMUSCULAR | Status: AC
  Filled 2017-07-10: qty 2

## 2017-07-10 MED ORDER — LIDOCAINE HCL (PF) 4 % IJ SOLN
INTRAMUSCULAR | Status: AC
  Filled 2017-07-10: qty 5

## 2017-07-10 MED ORDER — MOXIFLOXACIN HCL 0.5 % OP SOLN
OPHTHALMIC | Status: AC
  Filled 2017-07-10: qty 3

## 2017-07-10 MED ORDER — LACTATED RINGERS IV SOLN
INTRAVENOUS | Status: DC | PRN
  Administered 2017-07-10: 08:00:00 via INTRAVENOUS

## 2017-07-10 MED ORDER — SODIUM HYALURONATE 23 MG/ML IO SOLN: INTRAOCULAR | Status: DC | PRN

## 2017-07-10 MED ORDER — EPINEPHRINE PF 1 MG/ML IJ SOLN
INTRAMUSCULAR | Status: AC
  Filled 2017-07-10: qty 1

## 2017-07-10 MED ORDER — FENTANYL CITRATE (PF) 100 MCG/2ML IJ SOLN
INTRAMUSCULAR | Status: DC | PRN
  Administered 2017-07-10 (×2): 50 ug via INTRAVENOUS

## 2017-07-10 MED ORDER — ARMC OPHTHALMIC DILATING DROPS
OPHTHALMIC | Status: AC
  Administered 2017-07-10: 1 via OPHTHALMIC
  Filled 2017-07-10: qty 0.4

## 2017-07-10 MED ORDER — POVIDONE-IODINE 5 % OP SOLN
OPHTHALMIC | Status: DC | PRN
  Administered 2017-07-10: 1 via OPHTHALMIC

## 2017-07-10 MED ORDER — NA CHONDROIT SULF-NA HYALURON 40-30 MG/ML IO SOLN
INTRAOCULAR | Status: DC | PRN
  Administered 2017-07-10: 1 mL via INTRAOCULAR

## 2017-07-10 MED ORDER — MOXIFLOXACIN HCL 0.5 % OP SOLN: 1.0000 [drp] | OPHTHALMIC | Status: DC | PRN

## 2017-07-10 MED ORDER — MIDAZOLAM HCL 2 MG/2ML IJ SOLN
INTRAMUSCULAR | Status: DC | PRN
  Administered 2017-07-10 (×2): 1 mg via INTRAVENOUS

## 2017-07-10 MED ORDER — MOXIFLOXACIN HCL 0.5 % OP SOLN
OPHTHALMIC | Status: DC | PRN
  Administered 2017-07-10: 0.2 mL via OPHTHALMIC

## 2017-07-10 MED ORDER — SODIUM CHLORIDE 0.9 % IV SOLN
INTRAVENOUS | Status: DC
  Administered 2017-07-10: 06:00:00 via INTRAVENOUS

## 2017-07-10 MED ORDER — LIDOCAINE HCL (PF) 4 % IJ SOLN
INTRAOCULAR | Status: DC | PRN
  Administered 2017-07-10: 1 mL via OPHTHALMIC

## 2017-07-10 MED ORDER — ARMC OPHTHALMIC DILATING DROPS
1.0000 "application " | OPHTHALMIC | Status: AC
  Administered 2017-07-10 (×3): 1 via OPHTHALMIC

## 2017-07-10 SURGICAL SUPPLY — 16 items
DISSECTOR HYDRO NUCLEUS 50X22 (MISCELLANEOUS) ×3 IMPLANT
GLOVE BIO SURGEON STRL SZ8 (GLOVE) ×6 IMPLANT
GLOVE BIOGEL M 6.5 STRL (GLOVE) ×6 IMPLANT
GLOVE SURG LX 7.5 STRW (GLOVE) ×2
GLOVE SURG LX STRL 7.5 STRW (GLOVE) ×1 IMPLANT
GOWN STRL REUS W/ TWL LRG LVL3 (GOWN DISPOSABLE) ×2 IMPLANT
GOWN STRL REUS W/TWL LRG LVL3 (GOWN DISPOSABLE) ×4
LABEL CATARACT MEDS ST (LABEL) ×3 IMPLANT
LENS IOL TECNIS ITEC 23.0 (Intraocular Lens) ×3 IMPLANT
PACK CATARACT (MISCELLANEOUS) ×3 IMPLANT
PACK CATARACT KING (MISCELLANEOUS) ×3 IMPLANT
PACK EYE AFTER SURG (MISCELLANEOUS) ×3 IMPLANT
SOL BSS BAG (MISCELLANEOUS) ×3
SOLUTION BSS BAG (MISCELLANEOUS) ×1 IMPLANT
WATER STERILE IRR 250ML POUR (IV SOLUTION) ×3 IMPLANT
WIPE NON LINTING 3.25X3.25 (MISCELLANEOUS) ×3 IMPLANT

## 2017-07-10 NOTE — Discharge Instructions (Signed)
Eye Surgery Discharge Instructions  Expect mild scratchy sensation or mild soreness. DO NOT RUB YOUR EYE!  The day of surgery:  Minimal physical activity, but bed rest is not required  No reading, computer work, or close hand work  No bending, lifting, or straining.  May watch TV  For 24 hours:  No driving, legal decisions, or alcoholic beverages  Safety precautions  Eat anything you prefer: It is better to start with liquids, then soup then solid foods.  _____ Eye patch should be worn until postoperative exam tomorrow.  ____ Solar shield eyeglasses should be worn for comfort in the sunlight/patch while sleeping  Resume all regular medications including aspirin or Coumadin if these were discontinued prior to surgery. You may shower, bathe, shave, or wash your hair. Tylenol may be taken for mild discomfort.  Call your doctor if you experience significant pain, nausea, or vomiting, fever > 101 or other signs of infection. [phone] or [phone] Specific instructions:  Follow-up Information    [place] Crane, MD Follow up.   Specialty:  Ophthalmology Why:  October 5 at 9:45am in [facility] information: [address] [place] [address] [phone]

## 2017-07-10 NOTE — Transfer of Care (Signed)
Immediate Anesthesia Transfer of Care Note  Patient: Darlene Carpenter  Procedure(s) Performed: CATARACT EXTRACTION PHACO AND INTRAOCULAR LENS PLACEMENT (IOC) (Left Eye)  Patient Location: PACU  Anesthesia Type:MAC  Level of Consciousness: awake  Airway & Oxygen Therapy: Patient Spontanous Breathing  Post-op Assessment: Report given to RN  Post vital signs: Reviewed and stable  Last Vitals:  Vitals:   07/10/17 0604  BP: (!) 142/70  Pulse: 83  Temp: 36.9 C  SpO2: 98%    Last Pain:  Vitals:   07/10/17 0604  TempSrc: Oral         Complications: No apparent anesthesia complications

## 2017-07-10 NOTE — H&P (Signed)
The History and Physical notes are on paper, have been signed, and are to be scanned.   I have examined the patient and there are no changes to the H&P.   Willey Blade 07/10/2017 7:21 AM

## 2017-07-10 NOTE — Anesthesia Postprocedure Evaluation (Signed)
Anesthesia Post Note  Patient: CANDI PROFIT  Procedure(s) Performed: CATARACT EXTRACTION PHACO AND INTRAOCULAR LENS PLACEMENT (IOC) (Left Eye)  Patient location during evaluation: PACU Anesthesia Type: MAC Level of consciousness: awake and alert Pain management: pain level controlled Vital Signs Assessment: post-procedure vital signs reviewed and stable Respiratory status: spontaneous breathing, nonlabored ventilation, respiratory function stable and patient connected to nasal cannula oxygen Cardiovascular status: stable and blood pressure returned to baseline Postop Assessment: no apparent nausea or vomiting Anesthetic complications: no     Last Vitals:  Vitals:   07/10/17 0811 07/10/17 0814  BP: (!) 154/64 (!) 167/66  Pulse: 74   Temp:  36.4 C  SpO2: 97% 98%    Last Pain:  Vitals:   07/10/17 0604  TempSrc: Oral                 Martha Clan

## 2017-07-10 NOTE — Anesthesia Post-op Follow-up Note (Signed)
Anesthesia QCDR form completed.        

## 2017-07-10 NOTE — Anesthesia Preprocedure Evaluation (Signed)
Anesthesia Evaluation  Patient identified by MRN, date of birth, ID band Patient awake    Reviewed: Allergy & Precautions, NPO status , Patient's Chart, lab work & pertinent test results, reviewed documented beta blocker date and time   History of Anesthesia Complications (+) PONV and history of anesthetic complications  Airway Mallampati: II  TM Distance: <3 FB     Dental  (+) Upper Dentures   Pulmonary sleep apnea , COPD,  COPD inhaler, former smoker,    Pulmonary exam normal        Cardiovascular hypertension, Pt. on medications and Pt. on home beta blockers + Peripheral Vascular Disease  Normal cardiovascular exam     Neuro/Psych negative neurological ROS  negative psych ROS   GI/Hepatic negative GI ROS,   Endo/Other  diabetes, Type 2  Renal/GU Renal InsufficiencyRenal disease     Musculoskeletal negative musculoskeletal ROS (+)   Abdominal Normal abdominal exam  (+)   Peds  Hematology negative hematology ROS (+)   Anesthesia Other Findings Past Medical History: No date: Chicken pox No date: CMV (cytomegalovirus infection) (HCC) No date: Complication of anesthesia     Comment: nausea and vomiting No date: Diabetes mellitus without complication (HCC) No date: Emphysema of lung (HCC) No date: Sleep apnea No date: UTI (lower urinary tract infection)   Doubl lung transplant per patient  Reproductive/Obstetrics                             Anesthesia Physical  Anesthesia Plan  ASA: III  Anesthesia Plan: MAC   Post-op Pain Management:    Induction: Intravenous  PONV Risk Score and Plan:   Airway Management Planned: Nasal Cannula  Additional Equipment:   Intra-op Plan:   Post-operative Plan:   Informed Consent: I have reviewed the patients History and Physical, chart, labs and discussed the procedure including the risks, benefits and alternatives for the proposed  anesthesia with the patient or authorized representative who has indicated his/her understanding and acceptance.   Dental advisory given  Plan Discussed with: CRNA and Surgeon  Anesthesia Plan Comments:         Anesthesia Quick Evaluation

## 2017-07-10 NOTE — Op Note (Signed)
OPERATIVE NOTE  Darlene Carpenter [id number] 07/10/2017   PREOPERATIVE DIAGNOSIS:  Nuclear sclerotic cataract left eye.  H25.12   POSTOPERATIVE DIAGNOSIS:    Nuclear sclerotic cataract left eye.     PROCEDURE:  Phacoemusification with posterior chamber intraocular lens placement of the left eye   LENS:   Implant Name Type Inv. Item Serial No. Manufacturer Lot No. LRB No. Used  LENS IOL DIOP 23.0 - [id number] Intraocular Lens LENS IOL DIOP 23.0 [id number] AMO   Left 1       PCB00 +23.0   ULTRASOUND TIME: 0 minutes 30 seconds.  CDE 2.56   SURGEON:  Willey Blade, MD, MPH   ANESTHESIA:  Topical with tetracaine drops augmented with 1% preservative-free intracameral lidocaine.  ESTIMATED BLOOD LOSS: <1 mL   COMPLICATIONS:  None.   DESCRIPTION OF PROCEDURE:  The patient was identified in the holding room and transported to the operating room and placed in the supine position under the operating microscope.  The left eye was identified as the operative eye and it was prepped and draped in the usual sterile ophthalmic fashion.   A 1.0 millimeter clear-corneal paracentesis was made at the 5:00 position. 0.5 ml of preservative-free 1% lidocaine with epinephrine was injected into the anterior chamber.  The anterior chamber was filled with Duovisc viscoelastic.  A 2.4 millimeter keratome was used to make a near-clear corneal incision at the 2:00 position.  A curvilinear capsulorrhexis was made with a cystotome and capsulorrhexis forceps.  Balanced salt solution was used to hydrodissect and hydrodelineate the nucleus.   Phacoemulsification was then used in stop and chop fashion to remove the lens nucleus and epinucleus.  The remaining cortex was then removed using the irrigation and aspiration handpiece. Discovisc was then placed into the capsular bag to distend it for lens placement.  A lens was then injected into the capsular bag.  The remaining viscoelastic was aspirated.   Wounds were  hydrated with balanced salt solution.  The anterior chamber was inflated to a physiologic pressure with balanced salt solution.  Intracameral vigamox 0.1 mL undiltued was injected into the eye and a drop placed onto the ocular surface.  No wound leaks were noted.  The patient was taken to the recovery room in stable condition without complications of anesthesia or surgery  Willey Blade 07/10/2017, 8:03 AM

## 2017-09-28 ENCOUNTER — Emergency Department: Payer: Medicare Other

## 2017-09-28 ENCOUNTER — Other Ambulatory Visit: Payer: Self-pay

## 2017-09-28 ENCOUNTER — Encounter: Payer: Self-pay | Admitting: Emergency Medicine

## 2017-09-28 ENCOUNTER — Emergency Department
Admission: EM | Admit: 2017-09-28 | Discharge: 2017-09-28 | Disposition: A | Discharge status: Home / Self Care | Payer: Medicare Other | Attending: Emergency Medicine | Admitting: Emergency Medicine

## 2017-09-28 DIAGNOSIS — Z79899 Other long term (current) drug therapy: Secondary | ICD-10-CM | POA: Insufficient documentation

## 2017-09-28 DIAGNOSIS — W108XXA Fall (on) (from) other stairs and steps, initial encounter: Secondary | ICD-10-CM | POA: Insufficient documentation

## 2017-09-28 DIAGNOSIS — N183 Chronic kidney disease, stage 3 (moderate): Secondary | ICD-10-CM | POA: Diagnosis not present

## 2017-09-28 DIAGNOSIS — S8991XA Unspecified injury of right lower leg, initial encounter: Secondary | ICD-10-CM | POA: Diagnosis present

## 2017-09-28 DIAGNOSIS — Y92009 Unspecified place in unspecified non-institutional (private) residence as the place of occurrence of the external cause: Secondary | ICD-10-CM | POA: Diagnosis not present

## 2017-09-28 DIAGNOSIS — Y9301 Activity, walking, marching and hiking: Secondary | ICD-10-CM | POA: Diagnosis not present

## 2017-09-28 DIAGNOSIS — Y999 Unspecified external cause status: Secondary | ICD-10-CM | POA: Diagnosis not present

## 2017-09-28 DIAGNOSIS — S92021A Displaced fracture of anterior process of right calcaneus, initial encounter for closed fracture: Secondary | ICD-10-CM | POA: Diagnosis not present

## 2017-09-28 DIAGNOSIS — Z87891 Personal history of nicotine dependence: Secondary | ICD-10-CM | POA: Diagnosis not present

## 2017-09-28 DIAGNOSIS — I129 Hypertensive chronic kidney disease with stage 1 through stage 4 chronic kidney disease, or unspecified chronic kidney disease: Secondary | ICD-10-CM | POA: Diagnosis not present

## 2017-09-28 MED ORDER — OXYCODONE HCL 5 MG PO TABS: 5.0000 mg | ORAL_TABLET | ORAL | 0 refills | Status: AC | PRN

## 2017-09-28 MED ORDER — OXYCODONE HCL 5 MG PO TABS
5.0000 mg | ORAL_TABLET | Freq: Once | ORAL | Status: AC
  Administered 2017-09-28: 5 mg via ORAL
  Filled 2017-09-28: qty 1

## 2017-09-28 MED ORDER — OXYCODONE HCL 5 MG PO TABS: 5.0000 mg | ORAL_TABLET | Freq: Once | ORAL | Status: DC

## 2017-09-28 NOTE — ED Notes (Addendum)
Pt says she got up to fix a piece of toast and she fell on the steps; pain/swelling/bruising to right ankle;

## 2017-09-28 NOTE — ED Provider Notes (Signed)
[facility] Emergency Department Provider Note ____________________________________________  Time seen: Approximately 7:20 AM  I have reviewed the triage vital signs and the nursing notes.   HISTORY  Chief Complaint Ankle Pain    HPI Darlene Carpenter is a 71 y.o. female who presents to the emergency department for evaluation and treatment of right ankle pain after a mechanical, non-syncopal fall this morning while going up the stairs in her home.  She has since been unable to bear weight on her right ankle.  She denies striking her head.  She has not taken anything for pain since the injury.  Ice has been applied to the ankle, but otherwise no alleviating measures.  Past Medical History:  Diagnosis Date  . Chicken pox   . CMV (cytomegalovirus infection) (HCC)   . Complication of anesthesia    nausea and vomiting  . Diabetes mellitus without complication (HCC)   . Emphysema of lung (HCC)   . Sleep apnea   . UTI (lower urinary tract infection)     Patient Active Problem List   Diagnosis Date Noted  . Encounter for preventive health examination 11/12/2016  . Venous (peripheral) insufficiency 08/09/2016  . DM (diabetes mellitus), type 2 with renal complications (HCC) 08/09/2016  . 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 COPD (chronic obstructive pulmonary disease) (HCC) 02/07/2014  . 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 CKD (chronic kidney disease) stage 3, GFR 30-59 ml/min (HCC) 02/07/2014  . Cytopenia 02/07/2014  . Weight gain 11/28/2013  . S/P lung transplant (HCC) 11/28/2013  . Insomnia 11/28/2013  . S/P vaginal hysterectomy 11/26/2013    Past Surgical History:  Procedure Laterality Date  . ABDOMINAL HYSTERECTOMY    . CATARACT EXTRACTION W/PHACO Right 03/20/2017   Procedure: CATARACT EXTRACTION PHACO AND INTRAOCULAR LENS PLACEMENT (IOC);  Surgeon: [place] Crane, MD;  Location: [facility] ORS;  Service: Ophthalmology;  Laterality: Right;  [place]   00:26.5 AP8.8 CDE2.33 FLUID LOT #[id number]   . CATARACT EXTRACTION W/PHACO Left 07/10/2017   Procedure: CATARACT EXTRACTION PHACO AND INTRAOCULAR LENS PLACEMENT (IOC);  Surgeon: [place] Crane, MD;  Location: [facility] ORS;  Service: Ophthalmology;  Laterality: Left;  [place] 00.30.7 AP16.2 CDE 2.56  . CHOLECYSTECTOMY    . LUNG TRANSPLANT, DOUBLE Bilateral 2011  . TUBAL LIGATION      Prior to Admission medications   Medication Sig Start Date End Date Taking? Authorizing Provider  azithromycin (ZITHROMAX) 250 MG tablet Take 250 mg by mouth 3 (three) times a week. Mon-Wed-Fri 07/31/16   [provider]  calcium citrate-vitamin D (CITRACAL+D) 315-200 MG-UNIT tablet Take 2 tablets by mouth 2 (two) times daily. 05/12/15   [provider]  Cetirizine HCl 10 MG CAPS Take 10 mg by mouth daily.    [provider]  escitalopram (LEXAPRO) 10 MG tablet take 1 tablet by mouth once daily Patient not taking: Reported on 07/01/2017 06/26/17   Sherlene Shams, MD  FLUoxetine (PROZAC) 10 MG capsule Take 10 mg by mouth daily.     [provider]  furosemide (LASIX) 40 MG tablet Take 40 mg by mouth daily as needed for fluid.     [provider]  Immune Globulin 10% (GAMUNEX-C) 10 GM/100ML SOLN Inject 30 g into the vein every 3 (three) months.  07/17/15   [provider]  losartan (COZAAR) 25 MG tablet Take 25 mg by mouth daily.    [provider]  magnesium aspartate (MAGINEX) 615 MG tablet Take 1,230 mg by mouth  2 (two) times daily.    [provider]  magnesium oxide (MAG-OX) 400 MG tablet Take 400 mg by mouth daily.    [provider]  metoprolol tartrate (LOPRESSOR) 25 MG tablet Take 25 mg by mouth 2 (two) times daily.  11/12/13   [provider]  Multiple Vitamins-Minerals (MULTIVITAMIN ADULT PO) Take 1 tablet by mouth daily.    [provider]  nystatin (MYCOSTATIN) 100000 UNIT/ML suspension Take 5 mLs by mouth 3  (three) times daily.  09/14/15   [provider]  nystatin ointment (MYCOSTATIN) Apply 1 application topically once a week. APPLY TO CORNERS OF MOUTH    [provider]  oxyCODONE (OXY IR/ROXICODONE) 5 MG immediate release tablet Take 1 tablet (5 mg total) by mouth every 4 (four) hours as needed for severe pain. 09/28/17   Nykeem Citro B, FNP  pantoprazole (PROTONIX) 40 MG tablet Take 40 mg by mouth daily. 12/12/16 12/12/17  [provider]  pentamidine (NEBUPENT) 300 MG inhalation solution Inhale 300 mg into the lungs every 30 (thirty) days.  10/22/16   [provider]  predniSONE (DELTASONE) 5 MG tablet Take 5 mg by mouth daily. 11/25/16   [provider]  promethazine (PHENERGAN) 12.5 MG tablet Take 12.5 mg by mouth every 6 (six) hours as needed for nausea or vomiting.    [provider]  tacrolimus (PROGRAF) 0.5 MG capsule Take 0.5 mg by mouth at bedtime.     [provider]  tacrolimus (PROGRAF) 1 MG capsule Take 1 mg by mouth daily.     [provider]  valGANciclovir (VALCYTE) 450 MG tablet Take 450 mg by mouth daily.     [provider]  zolpidem (AMBIEN) 10 MG tablet Take 10 mg by mouth at bedtime.     [provider]    Allergies Nsaids and Shellfish allergy  Family History  Problem Relation Age of Onset  . Hypertension Mother   . Hypertension Father   . Cancer Brother   . Diabetes Brother   . Heart disease Brother        Visual merchandiserace maker  . Diabetes Sister     Social History Social History   Tobacco Use  . Smoking status: Former Smoker    Packs/day: 1.00    Years: 45.00    Pack years: 45.00    Types: Cigarettes    Last attempt to quit: 11/26/2006    Years since quitting: 10.8  . Smokeless tobacco: Never Used  Substance Use Topics  . Alcohol use: No  . Drug use: No    Review of Systems Constitutional: Positive for recent fall Cardiovascular: Negative for active bleeding Respiratory:  Negative for shortness of breath Musculoskeletal: Positive for right ankle pain Skin: Positive for ecchymosis over the right ankle Neurological: Negative for paresthesias  ____________________________________________   PHYSICAL EXAM:  VITAL SIGNS: ED Triage Vitals [09/28/17 0707]  Enc Vitals Group     BP (!) 94/56     Pulse Rate 87     Resp 16     Temp 98.2 F (36.8 C)     Temp Source Oral     SpO2 96 %     Weight      Height      Head Circumference      Peak Flow      Pain Score 10     Pain Loc      Pain Edu?      Excl. in GC?  Constitutional: Alert and oriented. Well appearing and in no acute distress. Eyes: Conjunctivae are clear without discharge or drainage Head: Atraumatic Neck: Nexus criteria is negative Respiratory: Respirations even and unlabored Musculoskeletal: Right ankle ecchymotic and diffusely swollen. Compartment of the right ankle are soft. Neurologic: Motor and sensation over the right foot and ankle intact   Skin: Ecchymosis over the medial malleolus with diffuse swelling.  Psychiatric: Affect and behavior are appropriate.  ____________________________________________   LABS (all labs ordered are listed, but only abnormal results are displayed)  Labs Reviewed - No data to display ____________________________________________  RADIOLOGY  Image of the right ankle demonstrates a lucency and irregularity involving the posterior calcaneus that is concerning for acute fracture per radiology.  CT of the right foot shows a severely comminuted fracture of the anterior and mid calcaneus per radiology. ____________________________________________   PROCEDURES  .Splint Application Date/Time: 09/28/2017 12:23 PM Performed by: Chinita Pesterriplett, Katelynn Heidler B, FNP Authorized by: Chinita Pesterriplett, Torren Maffeo B, FNP   Consent:    Consent obtained:  Verbal   Consent given by:  Patient Pre-procedure details:    Sensation:  Normal Procedure details:    Laterality:  Right    Cast type:  Short leg   Splint type:  Short leg and ankle stirrup   Supplies:  Elastic bandage and Ortho-Glass Post-procedure details:    Pain:  Improved   Sensation:  Normal   Patient tolerance of procedure:  Tolerated well, no immediate complications    ____________________________________________   INITIAL IMPRESSION / ASSESSMENT AND PLAN / ED COURSE  Darlene Carpenter is a 71 y.o. female who presents to the ER for evaluation of right ankle pain after a mechanical, nonsyncopal fall while going up her steps this morning. X-ray to be completed.   ----------------------------------------- 8:19 AM on 09/28/2017 -----------------------------------------  X-ray results reviewed with the patient and family. Second exam of the right foot is not consistent with a calcaneal fracture, however due to the distracting pain of the ankle a CT has been ordered.  ----------------------------------------- 10:33 AM on 09/28/2017 -----------------------------------------  CT results reviewed. Orthopedic specialist paged.  ----------------------------------------- 11:04 AM on 09/28/2017 -----------------------------------------  Orthopedics on call, Dr. Loralie Champagneurrani, does not manage care for calcaneal fractures and recommended consult with podiatry.  Discussed patient with Dr. Orland Jarredroxler who recommends immobilization, complete non-weight bearing, and call first thing Wednesday morning for appointment with Dr. Ether GriffinsFowler. Patient's family were present in the room when discharge instructions were discussed.   Strict ER return precautions were discussed as well as potential side effects of the Oxycodone.  Medications  oxyCODONE (Oxy IR/ROXICODONE) immediate release tablet 5 mg (5 mg Oral Given 09/28/17 0744)    Pertinent labs & imaging results that were available during my care of the patient were reviewed by me and considered in my medical decision making (see chart for  details).  _________________________________________   FINAL CLINICAL IMPRESSION(S) / ED DIAGNOSES  Final diagnoses:  Closed displaced fracture of anterior process of right calcaneus, initial encounter    ED Discharge Orders        Ordered    oxyCODONE (OXY IR/ROXICODONE) 5 MG immediate release tablet  Every 4 hours PRN     09/28/17 1121       If controlled substance prescribed during this visit, 12 month history viewed on the [facility] prior to issuing an initial prescription for Schedule II or III opiod.    Chinita Pesterriplett, Dioselina Brumbaugh B, FNP 09/28/17 1227    Arnaldo NatalMalinda, Paul F, MD 09/28/17 1539

## 2017-09-28 NOTE — ED Triage Notes (Signed)
Pt states that she fell this morning going up the steps. Pt denies hitting head. States that she is having pain and swelling in the right ankle. Pt unable to bear weight.

## 2017-09-28 NOTE — Discharge Instructions (Signed)
No weight bearing.   Call Dr. Irene LimboFowler's office at 8:00am Wednesday morning to schedule an appointment.  Return to the ER for pain that is not well controlled with medication or for other concerns.

## 2018-01-13 LAB — CBC AND DIFFERENTIAL
Hemoglobin: 11.2 — AB (ref 12.0–16.0)
Platelets: 63 — AB (ref 150–399)
WBC: 5.8

## 2018-01-13 LAB — HEMOGLOBIN A1C: HEMOGLOBIN A1C: 5.9

## 2018-01-13 LAB — BASIC METABOLIC PANEL: CREATININE: 1.5 — AB (ref 0.5–1.1)

## 2018-01-26 ENCOUNTER — Ambulatory Visit (INDEPENDENT_AMBULATORY_CARE_PROVIDER_SITE_OTHER): Payer: Medicare Other

## 2018-01-26 VITALS — BP 126/68 | HR 87 | Temp 98.2°F | Resp 15 | Ht 62.0 in | Wt 149.4 lb

## 2018-01-26 DIAGNOSIS — Z1159 Encounter for screening for other viral diseases: Secondary | ICD-10-CM | POA: Diagnosis not present

## 2018-01-26 DIAGNOSIS — Z0000 Encounter for general adult medical examination without abnormal findings: Secondary | ICD-10-CM | POA: Diagnosis not present

## 2018-01-26 NOTE — Progress Notes (Signed)
Subjective:   Darlene Carpenter is a 72 y.o. female who presents for Medicare Annual (Subsequent) preventive examination.  Review of Systems:  No ROS.  Medicare Wellness Visit. Additional risk factors are reflected in the social history. Cardiac Risk Factors include: advanced age (>21men, >8 women);hypertension;diabetes mellitus;obesity (BMI >30kg/m2)     Objective:     Vitals: BP 126/68 (BP Location: Left Arm, Patient Position: Sitting, Cuff Size: Normal)   Pulse 87   Temp 98.2 F (36.8 C) (Oral)   Resp 15   Ht 5\' 2"  (1.575 m)   Wt 149 lb 6.4 oz (67.8 kg)   SpO2 97%   BMI 27.33 kg/m   Body mass index is 27.33 kg/m.  Advanced Directives 01/26/2018 09/28/2017 03/20/2017 01/28/2017 01/23/2017  Does Patient Have a Medical Advance Directive? Yes Yes Yes Yes Yes  Type of Estate agent of [place];Living will Healthcare Power of [place];Living will Healthcare Power of [place];Living will Healthcare Power of [place];Living will Healthcare Power of [place];Living will  Does patient want to make changes to medical advance directive? No - Patient declined No - Patient declined No - Patient declined No - Patient declined No - Patient declined  Copy of Healthcare Power of Attorney in Chart? No - copy requested No - copy requested No - copy requested - -  Would patient like information on creating a medical advance directive? - No - Patient declined - - -    Tobacco Social History   Tobacco Use  Smoking Status Former Smoker  . Packs/day: 1.00  . Years: 45.00  . Pack years: 45.00  . Types: Cigarettes  . Last attempt to quit: 11/26/2006  . Years since quitting: 11.1  Smokeless Tobacco Never Used     Counseling given: Not Answered   Clinical Intake:  Pre-visit preparation completed: Yes  Pain : No/denies pain     Nutritional Status: BMI > 30  Obese Diabetes: Yes(Followed by Endocrinology. See care everywhere. [facility]. )  How often do you need to have  someone help you when you read instructions, pamphlets, or other written materials from your doctor or pharmacy?: 1 - Never  Interpreter Needed?: No     Past Medical History:  Diagnosis Date  . Chicken pox   . CMV (cytomegalovirus infection) (HCC)   . Complication of anesthesia    nausea and vomiting  . Diabetes mellitus without complication (HCC)   . Emphysema of lung (HCC)   . Sleep apnea   . UTI (lower urinary tract infection)    Past Surgical History:  Procedure Laterality Date  . ABDOMINAL HYSTERECTOMY    . CATARACT EXTRACTION W/PHACO Right 03/20/2017   Procedure: CATARACT EXTRACTION PHACO AND INTRAOCULAR LENS PLACEMENT (IOC);  Surgeon: Nevada Crane, MD;  Location: [facility] ORS;  Service: Ophthalmology;  Laterality: Right;  [place]  00:26.5 AP8.8 CDE2.33 FLUID LOT #[id number]   . CATARACT EXTRACTION W/PHACO Left 07/10/2017   Procedure: CATARACT EXTRACTION PHACO AND INTRAOCULAR LENS PLACEMENT (IOC);  Surgeon: Nevada Crane, MD;  Location: [facility] ORS;  Service: Ophthalmology;  Laterality: Left;  [place] 00.30.7 AP16.2 CDE 2.56  . CHOLECYSTECTOMY    . LUNG TRANSPLANT, DOUBLE Bilateral 2011  . TUBAL LIGATION     Family History  Problem Relation Age of Onset  . Hypertension Mother   . Hypertension Father   . Cancer Brother   . Diabetes Brother   . Heart disease Brother        Visual merchandiser  . Diabetes Sister  Social History   Socioeconomic History  . Marital status: Married    Spouse name: Not on file  . Number of children: Not on file  . Years of education: Not on file  . Highest education level: Not on file  Occupational History  . Not on file  Social Needs  . Financial resource strain: Not hard at all  . Food insecurity:    Worry: Never true    Inability: Never true  . Transportation needs:    Medical: No    Non-medical: No  Tobacco Use  . Smoking status: Former Smoker    Packs/day: 1.00    Years: 45.00    Pack years: 45.00    Types: Cigarettes    Last  attempt to quit: 11/26/2006    Years since quitting: 11.1  . Smokeless tobacco: Never Used  Substance and Sexual Activity  . Alcohol use: No  . Drug use: No  . Sexual activity: Never  Lifestyle  . Physical activity:    Days per week: 0 days    Minutes per session: Not on file  . Stress: Only a little  Relationships  . Social connections:    Talks on phone: Not on file    Gets together: Not on file    Attends religious service: Not on file    Active member of club or organization: Not on file    Attends meetings of clubs or organizations: Not on file    Relationship status: Not on file  Other Topics Concern  . Not on file  Social History Narrative  . Not on file    Outpatient Encounter Medications as of 01/26/2018  Medication Sig  . azithromycin (ZITHROMAX) 250 MG tablet Take 250 mg by mouth 3 (three) times a week. Mon-Wed-Fri  . calcium citrate-vitamin D (CITRACAL+D) 315-200 MG-UNIT tablet Take 2 tablets by mouth 2 (two) times daily.  . Cetirizine HCl 10 MG CAPS Take 10 mg by mouth daily.  Marland Kitchen. escitalopram (LEXAPRO) 10 MG tablet take 1 tablet by mouth once daily  . FLUoxetine (PROZAC) 10 MG capsule Take 10 mg by mouth daily.   . furosemide (LASIX) 40 MG tablet Take 40 mg by mouth daily as needed for fluid.   . Immune Globulin 10% (GAMUNEX-C) 10 GM/100ML SOLN Inject 30 g into the vein every 3 (three) months.   Marland Kitchen. losartan (COZAAR) 25 MG tablet Take 25 mg by mouth daily.  . magnesium aspartate (MAGINEX) 615 MG tablet Take 1,230 mg by mouth 2 (two) times daily.  . magnesium oxide (MAG-OX) 400 MG tablet Take 400 mg by mouth daily.  . metoprolol tartrate (LOPRESSOR) 25 MG tablet Take 25 mg by mouth 2 (two) times daily.   . Multiple Vitamins-Minerals (MULTIVITAMIN ADULT PO) Take 1 tablet by mouth daily.  Marland Kitchen. nystatin (MYCOSTATIN) 100000 UNIT/ML suspension Take 5 mLs by mouth 3 (three) times daily.   Marland Kitchen. nystatin ointment (MYCOSTATIN) Apply 1 application topically once a week. APPLY TO  CORNERS OF MOUTH  . oxyCODONE (OXY IR/ROXICODONE) 5 MG immediate release tablet Take 1 tablet (5 mg total) by mouth every 4 (four) hours as needed for severe pain.  . pentamidine (NEBUPENT) 300 MG inhalation solution Inhale 300 mg into the lungs every 30 (thirty) days.   . predniSONE (DELTASONE) 5 MG tablet Take 5 mg by mouth daily.  . promethazine (PHENERGAN) 12.5 MG tablet Take 12.5 mg by mouth every 6 (six) hours as needed for nausea or vomiting.  . tacrolimus (PROGRAF) 0.5 MG  capsule Take 0.5 mg by mouth at bedtime.   . tacrolimus (PROGRAF) 1 MG capsule Take 1 mg by mouth daily.   . valGANciclovir (VALCYTE) 450 MG tablet Take 450 mg by mouth daily.   Marland Kitchen zolpidem (AMBIEN) 10 MG tablet Take 10 mg by mouth at bedtime.   . pantoprazole (PROTONIX) 40 MG tablet Take 40 mg by mouth daily.   No facility-administered encounter medications on file as of 01/26/2018.     Activities of Daily Living In your present state of health, do you have any difficulty performing the following activities: 01/26/2018  Hearing? Y  Comment R ear worse than L in a crowd  Vision? N  Difficulty concentrating or making decisions? N  Walking or climbing stairs? Y  Comment Unsteady gait due to broken heel, R foot  Dressing or bathing? N  Doing errands, shopping? N  Preparing Food and eating ? N  Using the Toilet? N  In the past six months, have you accidently leaked urine? N  Do you have problems with loss of bowel control? N  Managing your Medications? N  Managing your Finances? N  Housekeeping or managing your Housekeeping? N  Some recent data might be hidden    Patient Care Team: Sherlene Shams, MD as PCP - General (Internal Medicine)    Assessment:   This is a routine wellness examination for Lanijah.  The goal of the wellness visit is to assist the patient how to close the gaps in care and create a preventative care plan for the patient.   The roster of all physicians providing medical care to patient is  listed in the Snapshot section of the chart.  Taking calcium VIT D as appropriate/Osteoporosis risk reviewed.    Safety issues reviewed; Smoke and carbon monoxide detectors in the home. No firearms or firearms locked in a safe within the home. Wears seatbelts when driving or riding with others. No violence in the home.  They do not have excessive sun exposure.  Discussed the need for sun protection: hats, long sleeves and the use of sunscreen if there is significant sun exposure.  Patient is alert, normal appearance, oriented to person/place/and time.  Correctly identified the president of the [place] and recalls of 3/3 words. Performs simple calculations and can read correct time from watch face. Displays appropriate judgement.  No new identified risk were noted.  No failures at ADL's or IADL's.    BMI- discussed the importance of a healthy diet, water intake and the benefits of aerobic exercise. Educational material provided.   24 hour diet recall: Low carb diet  Dental- every 6 months.  Eye- Visual acuity not assessed per patient preference since they have regular follow up with the ophthalmologist.  Wears corrective lenses.  Sleep patterns- Sleeps 5-7 hours at night.  Wakes feeling rested. CPAP in use. Taking medication as prescribed.   Hepatitis C screening complete. Educational material provided.  Cologuard in progress.   Diabetes- followed by Endocrinologist.  See care everywhere.  Last A1c 01/13/18 (5.8 result)  Patient Concerns: None at this time. Follow up with PCP as needed.  Exercise Activities and Dietary recommendations Current Exercise Habits: The patient does not participate in regular exercise at present  Goals    . Increase physical activity     Walk for exercise, as tolerated Use ankle brace for support       Fall Risk Fall Risk  01/26/2018 01/28/2017 01/23/2017 11/11/2016  Falls in the past year? No No No  No   Depression Screen PHQ 2/9 Scores 01/26/2018  01/28/2017 01/23/2017 11/11/2016  PHQ - 2 Score 0 0 0 0     Cognitive Function MMSE - Mini Mental State Exam 01/26/2018 01/23/2017  Orientation to time 5 5  Orientation to Place 5 5  Registration 3 3  Attention/ Calculation 5 5  Recall 3 3  Language- name 2 objects 2 2  Language- repeat 1 1  Language- follow 3 step command 3 3  Language- read & follow direction 1 1  Write a sentence 1 1  Copy design 1 1  Total score 30 30        Immunization History  Administered Date(s) Administered  . Influenza Split 08/26/2013  . Influenza-Unspecified 05/09/2016  . Pneumococcal Conjugate-13 08/10/2012  . Pneumococcal Polysaccharide-23 11/26/2009, 11/11/2016   Screening Tests Health Maintenance  Topic Date Due  . Hepatitis C Screening  30-Oct-1945  . COLONOSCOPY  06/17/1995  . HEMOGLOBIN A1C  02/06/2017  . FOOT EXAM  01/06/2018  . OPHTHALMOLOGY EXAM  01/10/2018  . TETANUS/TDAP  02/03/2018 (Originally 06/16/1964)  . INFLUENZA VACCINE  05/07/2018  . MAMMOGRAM  10/01/2018  . DEXA SCAN  Completed  . PNA vac Low Risk Adult  Completed      Plan:    End of life planning; Advance aging; Advanced directives discussed. Copy of current HCPOA/Living Will requested.    I have personally reviewed and noted the following in the patient's chart:   . Medical and social history . Use of alcohol, tobacco or illicit drugs  . Current medications and supplements . Functional ability and status . Nutritional status . Physical activity . Advanced directives . List of other physicians . Hospitalizations, surgeries, and ER visits in previous 12 months . Vitals . Screenings to include cognitive, depression, and falls . Referrals and appointments  In addition, I have reviewed and discussed with patient certain preventive protocols, quality metrics, and best practice recommendations. A written personalized care plan for preventive services as well as general preventive health recommendations were provided  to patient.     Ashok Pall, LPN  1/61/0960

## 2018-01-26 NOTE — Patient Instructions (Addendum)
  Ms. Darlene Carpenter , Thank you for taking time to come for your Medicare Wellness Visit. I appreciate your ongoing commitment to your health goals. Please review the following plan we discussed and let me know if I can assist you in the future.   Follow up as needed.    Bring a copy of your Health Care Power of Attorney and/or Living Will to be scanned into chart.  Have a great day!  These are the goals we discussed: Goals    . Increase physical activity     Walk for exercise, as tolerated Use ankle brace for support       This is a list of the screening recommended for you and due dates:  Health Maintenance  Topic Date Due  .  Hepatitis C: One time screening is recommended by Center for Disease Control  (CDC) for  adults born from [birth year] through 1966.   November 12, 1945  . Colon Cancer Screening  06/17/1995  . Hemoglobin A1C  02/06/2017  . Complete foot exam   01/06/2018  . Eye exam for diabetics  01/10/2018  . Tetanus Vaccine  02/03/2018*  . Flu Shot  05/07/2018  . Mammogram  10/01/2018  . DEXA scan (bone density measurement)  Completed  . Pneumonia vaccines  Completed  *Topic was postponed. The date shown is not the original due date.

## 2018-01-27 ENCOUNTER — Encounter: Payer: Self-pay | Admitting: Internal Medicine

## 2018-01-27 ENCOUNTER — Ambulatory Visit: Payer: Medicare Other | Admitting: Internal Medicine

## 2018-01-27 DIAGNOSIS — K120 Recurrent oral aphthae: Secondary | ICD-10-CM | POA: Diagnosis not present

## 2018-01-27 DIAGNOSIS — E1122 Type 2 diabetes mellitus with diabetic chronic kidney disease: Secondary | ICD-10-CM | POA: Diagnosis not present

## 2018-01-27 DIAGNOSIS — N183 Chronic kidney disease, stage 3 unspecified: Secondary | ICD-10-CM

## 2018-01-27 MED ORDER — ACYCLOVIR 5 % EX OINT: 1.0000 "application " | TOPICAL_OINTMENT | CUTANEOUS | Status: DC

## 2018-01-27 MED ORDER — ACYCLOVIR 5 % EX OINT: 1.0000 "application " | TOPICAL_OINTMENT | CUTANEOUS | Status: AC

## 2018-01-27 NOTE — Patient Instructions (Addendum)
I have prescribed acyclovir ointment to apply to your mouth ulcer every 3 hours while awake  I have ordered your mammogram  Avoid "sugar free" or "diet" sodas when you are having diarhea; it may make the diarrhea worse because the artificial sugars can be hard to digest  Simplify your diet until your diarrhea has resolved:  White meat chicken, rice,  Broth,  Crackers.  No dairy no salads or grease.   If your diarrhea persists for more than 3-4 days , please return for additional evaluation

## 2018-01-27 NOTE — Assessment & Plan Note (Addendum)
[facility]  tresiba 20 untis and sliding scale insulin qac Lab Results  Component Value Date   HGBA1C 5.9 01/13/2018

## 2018-01-27 NOTE — Progress Notes (Signed)
Subjective:  Patient ID: Darlene Carpenter, female    DOB: Sep 26, 1946  Age: 72 y.o. MRN: [id number]  CC: Diagnoses of Type 2 diabetes mellitus with stage 3 chronic kidney disease, without long-term current use of insulin (HCC), Oral aphthous ulcer, and CKD (chronic kidney disease) stage 3, GFR 30-59 ml/min (HCC) were pertinent to this visit.  HPI DELLAMAE Carpenter presents for follow up on multiple issues.  Last seen one year ago.  1) evaluation of mouth sores first noticed a week ago. The sore is on the inside of her  Lower lip . She has not had more than one. It is tender. She has also developed nausea vomiting and diarrhea that started last evening, with multiple episodes last  night.  She has  been able to eat toast  Today without vomiting.    stil having loose watery stools today 2 so far.   Reviewed [facility] record,   Had diarrhea in  early April ,  Infectious workup was done and was negative. No recent use of antibiotics. 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      Patient has not been seen in a year.  Had her annual   bronchoscopy 4 days ago , no evidence of rejection   Developed low white cell count and low platelets.  Required a platelet transfusion.  Was told to use immodium during previous episode. Last cbc April 177 wbc 5.5  platels 74K   Recent fracture of right foot. Saw [facility] for heel fracture ,  No surgery needed   T2dm: fasting sugars < 120.   Pre dinner  174 .  Sees endocrinologist at [facility]    Due for mammogram .  DEXA done last year   Had a colonoscopy done by [facility] provider  No records  Insurance sent her a cologuard. Hasn't done it yet    Outpatient Medications Prior to Visit  Medication Sig Dispense Refill  . calcium citrate-vitamin D (CITRACAL+D) 315-200 MG-UNIT tablet Take 2 tablets by mouth 2 (two) times daily.    . Cetirizine HCl 10 MG CAPS Take 10 mg by mouth daily.    Marland Kitchen escitalopram (LEXAPRO) 10 MG tablet take 1  tablet by mouth once daily 30 tablet 0  . FLUoxetine (PROZAC) 10 MG capsule Take 10 mg by mouth daily.     . furosemide (LASIX) 40 MG tablet Take 40 mg by mouth daily as needed for fluid.     . Immune Globulin 10% (GAMUNEX-C) 10 GM/100ML SOLN Inject 30 g into the vein every 3 (three) months.     Marland Kitchen losartan (COZAAR) 25 MG tablet Take 25 mg by mouth daily.    . magnesium aspartate (MAGINEX) 615 MG tablet Take 1,230 mg by mouth 2 (two) times daily.    . magnesium oxide (MAG-OX) 400 MG tablet Take 400 mg by mouth daily.    . metoprolol tartrate (LOPRESSOR) 25 MG tablet Take 25 mg by mouth 2 (two) times daily.     . Multiple Vitamins-Minerals (MULTIVITAMIN ADULT PO) Take 1 tablet by mouth daily.    . pentamidine (NEBUPENT) 300 MG inhalation solution Inhale 300 mg into the lungs every 30 (thirty) days.     . predniSONE (DELTASONE) 5 MG tablet Take 5 mg by mouth daily.    . promethazine (PHENERGAN) 12.5 MG tablet Take 12.5 mg by mouth every 6 (six) hours as needed for nausea or vomiting.    . tacrolimus (PROGRAF) 0.5 MG capsule  Take 0.5 mg by mouth at bedtime.     . tacrolimus (PROGRAF) 1 MG capsule Take 1 mg by mouth daily.     . valGANciclovir (VALCYTE) 450 MG tablet Take 450 mg by mouth daily.     Marland Kitchen zolpidem (AMBIEN) 10 MG tablet Take 10 mg by mouth at bedtime.     Marland Kitchen azithromycin (ZITHROMAX) 250 MG tablet Take 250 mg by mouth 3 (three) times a week. Mon-Wed-Fri    . oxyCODONE (OXY IR/ROXICODONE) 5 MG immediate release tablet Take 1 tablet (5 mg total) by mouth every 4 (four) hours as needed for severe pain. (Patient not taking: Reported on 01/27/2018) 20 tablet 0  . pantoprazole (PROTONIX) 40 MG tablet Take 40 mg by mouth daily.    Marland Kitchen nystatin (MYCOSTATIN) 100000 UNIT/ML suspension Take 5 mLs by mouth 3 (three) times daily.     Marland Kitchen nystatin ointment (MYCOSTATIN) Apply 1 application topically once a week. APPLY TO CORNERS OF MOUTH     No facility-administered medications prior to visit.     Review of  Systems;  Patient denies headache, fevers, malaise, unintentional weight loss, skin rash, eye pain, sinus congestion and sinus pain, sore throat, dysphagia,  hemoptysis , cough, dyspnea, wheezing, chest pain, palpitations, orthopnea, edema, abdominal pain, nausea, melena, diarrhea, constipation, flank pain, dysuria, hematuria, urinary  Frequency, nocturia, numbness, tingling, seizures,  Focal weakness, Loss of consciousness,  Tremor, insomnia, depression, anxiety, and suicidal ideation.      Objective:  BP 132/72 (BP Location: Left Arm, Patient Position: Sitting, Cuff Size: Normal)   Pulse 84   Temp 98.6 F (37 C) (Oral)   Wt 146 lb 8 oz (66.5 kg)   SpO2 97%   BMI 26.80 kg/m   BP Readings from Last 3 Encounters:  01/27/18 132/72  01/26/18 126/68  09/28/17 106/72    Wt Readings from Last 3 Encounters:  01/27/18 146 lb 8 oz (66.5 kg)  01/26/18 149 lb 6.4 oz (67.8 kg)  07/01/17 171 lb (77.6 kg)    General appearance: alert, cooperative and appears stated age Oropharynx:uleration of mucosa of lower lip  Neck: no adenopathy, no carotid bruit, supple, symmetrical, trachea midline and thyroid not enlarged, symmetric, no tenderness/mass/nodules Back: symmetric, no curvature. ROM normal. No CVA tenderness. Lungs: clear to auscultation bilaterally Heart: regular rate and rhythm, S1, S2 normal, no murmur, click, rub or gallop Abdomen: soft, non-tender; bowel sounds normal; no masses,  no organomegaly Pulses: 2+ and symmetric Skin: Skin color, texture, turgor normal. No rashes or lesions Lymph nodes: Cervical, supraclavicular, and axillary nodes normal.  Lab Results  Component Value Date   HGBA1C 5.9 01/13/2018   HGBA1C 7.1 (H) 08/09/2016   HGBA1C 7.0 03/11/2016    Lab Results  Component Value Date   CREATININE 1.5 (A) 01/13/2018   CREATININE 1.6 (A) 03/11/2016    Lab Results  Component Value Date   WBC 5.8 01/13/2018   HGB 11.2 (A) 01/13/2018   HCT 36 07/10/2016   PLT 63  (A) 01/13/2018   CHOL 137 11/11/2016   TRIG 186.0 (H) 11/11/2016   HDL 45.50 11/11/2016   LDLCALC 54 11/11/2016   ALT 26 07/10/2016   AST 23 07/10/2016   NA 141 03/11/2016   K 3.5 03/11/2016   CREATININE 1.5 (A) 01/13/2018   HGBA1C 5.9 01/13/2018   MICROALBUR 2.1 (H) 11/11/2016    Dg Ankle Complete Right  Result Date: 09/28/2017 CLINICAL DATA:  Right ankle pain after fall today. EXAM: RIGHT ANKLE - COMPLETE 3+  VIEW COMPARISON:  None. FINDINGS: Distal tibia and fibula and talus are unremarkable. Soft tissue swelling is noted medially and laterally. Lucency and irregularity is seen involving the posterior calcaneus concerning for acute fracture. IMPRESSION: Lucency and irregularity is seen involving the posterior calcaneus concerning for acute fracture. CT scan is recommended for further evaluation. Electronically Signed   By: Lupita RaiderJames  Green Jr, M.D.   On: 09/28/2017 07:49   Ct Foot Right Wo Contrast  Result Date: 09/28/2017 CLINICAL DATA:  Status post fall this morning. Unable to bear weight. EXAM: CT OF THE RIGHT FOOT WITHOUT CONTRAST TECHNIQUE: Multidetector CT imaging of the right foot was performed according to the standard protocol. Multiplanar CT image reconstructions were also generated. COMPARISON:  None. FINDINGS: Bones/Joint/Cartilage Severe osteopenia. Severely comminuted fracture of the anterior and mid calcaneus. Major vertical fracture cleft in the mid calcaneus extends to the articular surface of the posterior subtalar joint. Multiple fracture clefts extend to the sinus tarsi. Nondisplaced fracture of the anterior process of the calcaneus and to the articular surface at the calcaneal cuboid joint. No other fracture or dislocation. Ankle mortise is intact. Lisfranc joint is congruent. Moderate ankle joint effusion. Ligaments Ligaments are suboptimally evaluated by CT. Muscles and Tendons Muscles are normal. Flexor, extensor, peroneal and Achilles tendons are intact. Soft tissue No  fluid collection or hematoma. No soft tissue mass. Severe soft tissue edema around the hindfoot. IMPRESSION: 1. Severely comminuted fracture of the anterior and mid calcaneus. Major vertical fracture cleft in the mid calcaneus extends to the articular surface of the posterior subtalar joint. Multiple fracture clefts extend to the sinus tarsi. Nondisplaced fracture of the anterior process of the calcaneus and to the articular surface at the calcaneal cuboid joint. Electronically Signed   By: Elige KoHetal  Patel   On: 09/28/2017 10:20    Assessment & Plan:   Problem List Items Addressed This Visit    Oral aphthous ulcer    Given her leukopenia,  Will treat with acyclovir      DM (diabetes mellitus), type 2 with renal complications (HCC)    [facility]  tresiba 20 untis and sliding scale insulin qac Lab Results  Component Value Date   HGBA1C 5.9 01/13/2018         Relevant Orders   MM 3D SCREEN BREAST BILATERAL   CKD (chronic kidney disease) stage 3, GFR 30-59 ml/min (HCC)    [facility]         I have discontinued Kandyce L. Acree's nystatin and nystatin ointment. I am also having her maintain her metoprolol tartrate, zolpidem, tacrolimus, tacrolimus, valGANciclovir, azithromycin, Immune Globulin 10%, pentamidine, Cetirizine HCl, predniSONE, pantoprazole, Multiple Vitamins-Minerals (MULTIVITAMIN ADULT PO), calcium citrate-vitamin D, furosemide, FLUoxetine, losartan, escitalopram, promethazine, magnesium aspartate, magnesium oxide, oxyCODONE, and acyclovir ointment.  Meds ordered this encounter  Medications  . DISCONTD: acyclovir ointment (ZOVIRAX) 5 %    Sig: Apply 1 application topically every 3 (three) hours. To mouth ulcer    Dispense:  15 g    Refill:  g  . acyclovir ointment (ZOVIRAX) 5 %    Sig: Apply 1 application topically every 3 (three) hours. To mouth ulcer    Dispense:  15 g    Refill:  g    Medications Discontinued During This  Encounter  Medication Reason  . nystatin (MYCOSTATIN) 100000 UNIT/ML suspension Completed Course  . nystatin ointment (MYCOSTATIN) Completed Course  . acyclovir ointment (ZOVIRAX) 5 % Reorder    Follow-up:  Return in about 3 months (around 04/28/2018) for cpe .   Sherlene Shams, MD

## 2018-01-28 DIAGNOSIS — K120 Recurrent oral aphthae: Secondary | ICD-10-CM | POA: Insufficient documentation

## 2018-01-28 LAB — HEPATITIS C ANTIBODY
Hepatitis C Ab: NONREACTIVE
SIGNAL TO CUT-OFF: 0.08 (ref <1.00)

## 2018-01-28 NOTE — Assessment & Plan Note (Signed)
Stable by [facility].

## 2018-01-28 NOTE — Assessment & Plan Note (Signed)
Given her leukopenia,  Will treat with acyclovir

## 2018-02-04 ENCOUNTER — Encounter: Payer: Medicare Other | Admitting: Internal Medicine

## 2019-01-28 ENCOUNTER — Ambulatory Visit: Payer: Medicare Other

## 2019-04-13 ENCOUNTER — Other Ambulatory Visit: Payer: Self-pay

## 2019-04-13 ENCOUNTER — Ambulatory Visit: Payer: Medicare Other

## 2019-04-16 ENCOUNTER — Ambulatory Visit: Payer: Medicare Other

## 2019-07-14 ENCOUNTER — Other Ambulatory Visit: Payer: Self-pay | Admitting: Internal Medicine

## 2019-07-14 DIAGNOSIS — Z1231 Encounter for screening mammogram for malignant neoplasm of breast: Secondary | ICD-10-CM

## 2019-08-05 ENCOUNTER — Ambulatory Visit: Payer: Medicare Other

## 2019-12-09 LAB — HM DIABETES EYE EXAM

## 2019-12-10 ENCOUNTER — Ambulatory Visit: Payer: Self-pay | Attending: Internal Medicine

## 2019-12-10 DIAGNOSIS — Z23 Encounter for immunization: Secondary | ICD-10-CM | POA: Insufficient documentation

## 2019-12-10 NOTE — Progress Notes (Signed)
   Covid-19 Vaccination Clinic  Name:  Darlene Carpenter    MRN: [id number] DOB: Feb 20, 1946  12/10/2019  [name] was observed post Covid-19 immunization for 15 minutes without incident. She was provided with Vaccine Information Sheet and instruction to access the V-Safe system.   [name] was instructed to call 911 with any severe reactions post vaccine: Marland Kitchen Difficulty breathing  . Swelling of face and throat  . A fast heartbeat  . A bad rash all over body  . Dizziness and weakness   Immunizations Administered    Name Date Dose VIS Date Route   Pfizer COVID-19 Vaccine 12/10/2019  9:37 AM 0.3 mL 09/17/2019 Intramuscular   Manufacturer: [employer], [employer]   Lot: [id number]   NDC: [address]-0

## 2019-12-31 ENCOUNTER — Ambulatory Visit: Payer: Medicare PPO | Attending: Internal Medicine

## 2019-12-31 DIAGNOSIS — Z23 Encounter for immunization: Secondary | ICD-10-CM

## 2019-12-31 NOTE — Progress Notes (Signed)
   Covid-19 Vaccination Clinic  Name:  CHEYNA RETANA    MRN: [id number] DOB: [birth year]  12/31/2019  Ms. Bedwell was observed post Covid-19 immunization for 15 minutes without incident. She was provided with Vaccine Information Sheet and instruction to access the V-Safe system.   Ms. Wivell was instructed to call 911 with any severe reactions post vaccine: Marland Kitchen Difficulty breathing  . Swelling of face and throat  . A fast heartbeat  . A bad rash all over body  . Dizziness and weakness   Immunizations Administered    Name Date Dose VIS Date Route   Pfizer COVID-19 Vaccine 12/31/2019 10:54 AM 0.3 mL 09/17/2019 Intramuscular   Manufacturer: [employer], [employer]   Lot: [id number]   NDC: [address]-0

## 2020-02-23 ENCOUNTER — Telehealth: Payer: Self-pay | Admitting: Internal Medicine

## 2020-02-23 NOTE — Telephone Encounter (Signed)
-----   Message from Bobbye Charleston sent at 02/22/2020 12:44 PM EDT ----- Regarding: REMOVE PCP Pt states she does not see Dr. Darrick Huntsman anymore  Thank you

## 2020-02-23 NOTE — Telephone Encounter (Signed)
PCP removed no longer Dr. Melina Schools patient.

## 2020-10-10 ENCOUNTER — Telehealth: Payer: Self-pay | Admitting: Internal Medicine

## 2020-10-10 ENCOUNTER — Ambulatory Visit (INDEPENDENT_AMBULATORY_CARE_PROVIDER_SITE_OTHER): Payer: Medicare PPO

## 2020-10-10 ENCOUNTER — Ambulatory Visit
Admission: EM | Admit: 2020-10-10 | Discharge: 2020-10-10 | Disposition: A | Discharge status: Home / Self Care | Payer: Medicare PPO | Attending: Family Medicine | Admitting: Family Medicine

## 2020-10-10 DIAGNOSIS — M25531 Pain in right wrist: Secondary | ICD-10-CM

## 2020-10-10 DIAGNOSIS — S42401A Unspecified fracture of lower end of right humerus, initial encounter for closed fracture: Secondary | ICD-10-CM | POA: Diagnosis not present

## 2020-10-10 DIAGNOSIS — W19XXXA Unspecified fall, initial encounter: Secondary | ICD-10-CM | POA: Diagnosis not present

## 2020-10-10 DIAGNOSIS — S52044A Nondisplaced fracture of coronoid process of right ulna, initial encounter for closed fracture: Secondary | ICD-10-CM

## 2020-10-10 DIAGNOSIS — L03113 Cellulitis of right upper limb: Secondary | ICD-10-CM

## 2020-10-10 MED ORDER — DOXYCYCLINE HYCLATE 100 MG PO CAPS: 100.0000 mg | ORAL_CAPSULE | Freq: Two times a day (BID) | ORAL | 0 refills | Status: AC

## 2020-10-10 NOTE — ED Provider Notes (Signed)
[name]    CSN: [id number] Arrival date & time: 10/10/20  1018      History   Chief Complaint Chief Complaint  Patient presents with   Arm Injury    HPI Darlene Carpenter is a 75 y.o. female.   Patient is a 75 year old female who presents today with pain to the right arm, swelling and erythema.  This occurred a week ago.  Started after a fall where she landed on the arm and right facial area.  She also has some redness to the right eye.  No pain or trouble with vision of the eye.  No headaches, dizziness.  She is not on blood thinners.  Reporting some drainage from the right arm. Warm to touch. No fever.      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/2014   COPD (chronic obstructive pulmonary disease) ([place]) 02/07/2014   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   CKD (chronic kidney disease) stage 3, GFR 30-59 ml/min ([place]) 02/07/2014   Cytopenia 02/07/2014   Weight gain 11/28/2013   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/28/2013   Insomnia 11/28/2013   S/P vaginal hysterectomy 11/26/2013    Past Surgical History:  Procedure Laterality Date   ABDOMINAL HYSTERECTOMY     CATARACT EXTRACTION W/PHACO Right 03/20/2017   Procedure: CATARACT EXTRACTION PHACO AND INTRAOCULAR LENS PLACEMENT ([place]);  Surgeon: Eulogio Bear, MD;  Location: [facility] ORS;  Service: Ophthalmology;  Laterality: Right;  [place]  00:26.5 AP8.8 CDE2.33 FLUID  LOT #[id number]    CATARACT EXTRACTION W/PHACO Left 07/10/2017   Procedure: CATARACT EXTRACTION PHACO AND INTRAOCULAR LENS PLACEMENT ([facility]);  Surgeon: Eulogio Bear, MD;  Location: [facility] ORS;  Service: Ophthalmology;  Laterality: Left;  [place] 00.30.7 AP16.2 CDE 2.56   CHOLECYSTECTOMY     LUNG TRANSPLANT, DOUBLE Bilateral 2011   TUBAL LIGATION      OB History   No obstetric history on file.      Home Medications    Prior to Admission medications   Medication Sig Start Date End Date Taking? Authorizing Provider  doxycycline (VIBRAMYCIN) 100 MG capsule Take 1 capsule (100 mg total) by mouth 2 (two) times daily. 10/10/20  Yes Bazil Dhanani A, NP  acyclovir ointment (ZOVIRAX) 5 % Apply 1 application topically every 3 (three) hours. To mouth ulcer 01/27/18   Crecencio Mc, MD  azithromycin (ZITHROMAX) 250 MG tablet Take 250 mg by mouth 3 (three) times a week. Mon-Wed-Fri 07/31/16   [provider]  calcium citrate-vitamin D (CITRACAL+D) 315-200 MG-UNIT tablet Take 2 tablets by mouth 2 (two) times daily. 05/12/15   [provider]  Cetirizine HCl 10 MG CAPS Take 10 mg by mouth daily.    [provider]  escitalopram (LEXAPRO) 10 MG tablet take 1 tablet by mouth once daily 06/26/17   Crecencio Mc, MD  FLUoxetine (PROZAC) 10 MG capsule Take 10 mg by mouth daily.  [provider]  furosemide (LASIX) 40 MG tablet Take 40 mg by mouth daily as needed for fluid.     [provider]  Immune Globulin 10% (GAMUNEX-C) 10 GM/100ML SOLN Inject 30 g into the vein every 3 (three) months.  07/17/15   [provider]  losartan (COZAAR) 25 MG tablet Take 25 mg by mouth daily.    [provider]  magnesium aspartate (MAGINEX) 615 MG tablet Take 1,230 mg by mouth 2 (two) times daily.    [provider]  magnesium oxide (MAG-OX) 400 MG tablet Take 400 mg by mouth daily.    [provider]  metoprolol tartrate (LOPRESSOR) 25 MG  tablet Take 25 mg by mouth 2 (two) times daily.  11/12/13   [provider]  Multiple Vitamins-Minerals (MULTIVITAMIN ADULT PO) Take 1 tablet by mouth daily.    [provider]  oxyCODONE (OXY IR/ROXICODONE) 5 MG immediate release tablet Take 1 tablet (5 mg total) by mouth every 4 (four) hours as needed for severe pain. Patient not taking: Reported on 01/27/2018 09/28/17   Kem Boroughs B, FNP  pantoprazole (PROTONIX) 40 MG tablet Take 40 mg by mouth daily. 12/12/16 12/12/17  [provider]  pentamidine (NEBUPENT) 300 MG inhalation solution Inhale 300 mg into the lungs every 30 (thirty) days.  10/22/16   [provider]  predniSONE (DELTASONE) 5 MG tablet Take 5 mg by mouth daily. 11/25/16   [provider]  promethazine (PHENERGAN) 12.5 MG tablet Take 12.5 mg by mouth every 6 (six) hours as needed for nausea or vomiting.    [provider]  tacrolimus (PROGRAF) 0.5 MG capsule Take 0.5 mg by mouth at bedtime.     [provider]  tacrolimus (PROGRAF) 1 MG capsule Take 1 mg by mouth daily.     [provider]  valGANciclovir (VALCYTE) 450 MG tablet Take 450 mg by mouth daily.     [provider]  zolpidem (AMBIEN) 10 MG tablet Take 10 mg by mouth at bedtime.     [provider]    Family History Family History  Problem Relation Age of Onset   Hypertension Mother    Hypertension Father    Cancer Brother    Diabetes Brother    Heart disease Brother        Visual merchandiser   Diabetes Sister     Social History Social History   Tobacco Use   Smoking status: Former Smoker    Packs/day: 1.00    Years: 45.00    Pack years: 45.00    Types: Cigarettes    Quit date: 11/26/2006    Years since quitting: 13.8   Smokeless tobacco: Never Used  Vaping Use   Vaping Use: Never used  Substance Use Topics   Alcohol use: No   Drug use: No     Allergies   Nsaids and Shellfish allergy   Review of  Systems Review of Systems   Physical Exam Triage Vital Signs ED Triage Vitals [10/10/20 1120]  Enc Vitals Group     BP      Pulse      Resp      Temp      Temp src      SpO2      Weight      Height      Head Circumference      Peak Flow      Pain Score 8     Pain Loc  Pain Edu?      Excl. in GC?    No data found.  Updated Vital Signs There were no vitals taken for this visit.  Visual Acuity Right Eye Distance:   Left Eye Distance:   Bilateral Distance:    Right Eye Near:   Left Eye Near:    Bilateral Near:     Physical Exam Vitals and nursing note reviewed.  Constitutional:      General: She is not in acute distress.    Appearance: Normal appearance. She is not ill-appearing, toxic-appearing or diaphoretic.  HENT:     Head: Normocephalic.     Nose: Nose normal.  Eyes:     General: Lids are normal.     Extraocular Movements: Extraocular movements intact.     Conjunctiva/sclera:     Right eye: Hemorrhage present.     Pupils: Pupils are equal, round, and reactive to light.     Comments: Subconjunctival hemorrhage to the right eye.  Pulmonary:     Effort: Pulmonary effort is normal.  Musculoskeletal:        General: Normal range of motion.     Cervical back: Normal range of motion.     Comments: Generalized tenderness to the elbow area and wrist.  No obvious deformities.  Generalized swelling from the elbow down to the wrist with bruising, erythema, increased warmth to the forearm area.  Some seepage of clear fluid from the skin  Skin:    General: Skin is warm and dry.     Findings: No rash.  Neurological:     Mental Status: She is alert.  Psychiatric:        Mood and Affect: Mood normal.      UC Treatments / Results  Labs (all labs ordered are listed, but only abnormal results are displayed) Labs Reviewed - No data to display  EKG   Radiology DG Elbow Complete Right  Result Date: 10/10/2020 CLINICAL DATA:  Fall.  Arm injury. EXAM: RIGHT  ELBOW - COMPLETE 3+ VIEW COMPARISON:  No prior. FINDINGS: Diffuse osteopenia and mild degenerative change. Tiny nondisplaced fracture fragment at the tip of the coronoid process cannot be excluded. No evidence of dislocation. IMPRESSION: Diffuse osteopenia and mild degenerative change. Tiny nondisplaced fracture fragment the tip of the coronoid process cannot be excluded. Electronically Signed   By: Maisie Fus  Register   On: 10/10/2020 12:09   DG Wrist Complete Right  Result Date: 10/10/2020 CLINICAL DATA:  Persistent right wrist pain since fall 2 weeks ago. EXAM: RIGHT WRIST - COMPLETE 3+ VIEW COMPARISON:  None. FINDINGS: No acute fracture or dislocation. Mild scaphotrapeziotrapezoid, thumb IP, and second MCP joint osteoarthritis. Osteopenia. Soft tissues are unremarkable. IMPRESSION: 1. No acute osseous abnormality. Electronically Signed   By: Obie Dredge M.D.   On: 10/10/2020 12:09    Procedures Procedures (including critical care time)  Medications Ordered in UC Medications - No data to display  Initial Impression / Assessment and Plan / UC Course  I have reviewed the triage vital signs and the nursing notes.  Pertinent labs & imaging results that were available during my care of the patient were reviewed by me and considered in my medical decision making (see chart for details).     Closed fracture of the right elbow X-ray revealed a tiny nondisplaced fracture fragment at the tip of the coronoid process Wrist x-ray unremarkable Will place in sling and have her follow-up with Ortho.  She can take Tylenol for pain as needed.  Cellulitis Treating with doxycycline Follow up as needed for continued or worsening symptoms   Final Clinical Impressions(s) / UC Diagnoses   Final diagnoses:  Closed fracture of right elbow, initial encounter     Discharge Instructions     You have a tiny elbow fracture.  I am giving you a sling to use as needed for comfort. Treating you for infection  of the arm.  Take the antibiotics as prescribed.  You can take Tylenol for pain as needed. Follow up with orthopedics  Follow up as needed for continued or worsening symptoms     ED Prescriptions    Medication Sig Dispense Auth. Provider   doxycycline (VIBRAMYCIN) 100 MG capsule Take 1 capsule (100 mg total) by mouth 2 (two) times daily. 20 capsule Dahlia Byes A, NP     PDMP not reviewed this encounter.   Janace Aris, NP 10/10/20 1238

## 2020-10-10 NOTE — Telephone Encounter (Signed)
Spoke with pt and she stated that she does not see Dr. Darrick Huntsman as her primary care provider any longer. PCP has been removed off pt's chart.

## 2020-10-10 NOTE — Telephone Encounter (Signed)
Patient's daughter called in stated that mother had a fail last week now she has a red spot on her arm that is red swollen and is painful .

## 2020-10-10 NOTE — Discharge Instructions (Signed)
You have a tiny elbow fracture.  I am giving you a sling to use as needed for comfort. Treating you for infection of the arm.  Take the antibiotics as prescribed.  You can take Tylenol for pain as needed. Follow up with orthopedics  Follow up as needed for continued or worsening symptoms

## 2020-10-11 ENCOUNTER — Telehealth: Payer: Medicare PPO | Admitting: Nurse Practitioner

## 2020-10-11 DIAGNOSIS — L03113 Cellulitis of right upper limb: Secondary | ICD-10-CM | POA: Diagnosis not present

## 2020-10-11 MED ORDER — CEPHALEXIN 500 MG PO CAPS: 500.0000 mg | ORAL_CAPSULE | Freq: Two times a day (BID) | ORAL | 0 refills | Status: AC

## 2020-10-11 NOTE — Progress Notes (Signed)

## 2020-12-05 DEATH — deceased

## 2022-06-24 IMAGING — DX DG WRIST COMPLETE 3+V*R*
4 series · 4 of 4 positions shown · non-contrast
Comparison: None.

CLINICAL DATA: Persistent right wrist pain since fall 2 weeks ago.

EXAM:
RIGHT WRIST - COMPLETE 3+ VIEW

[wrist pa]
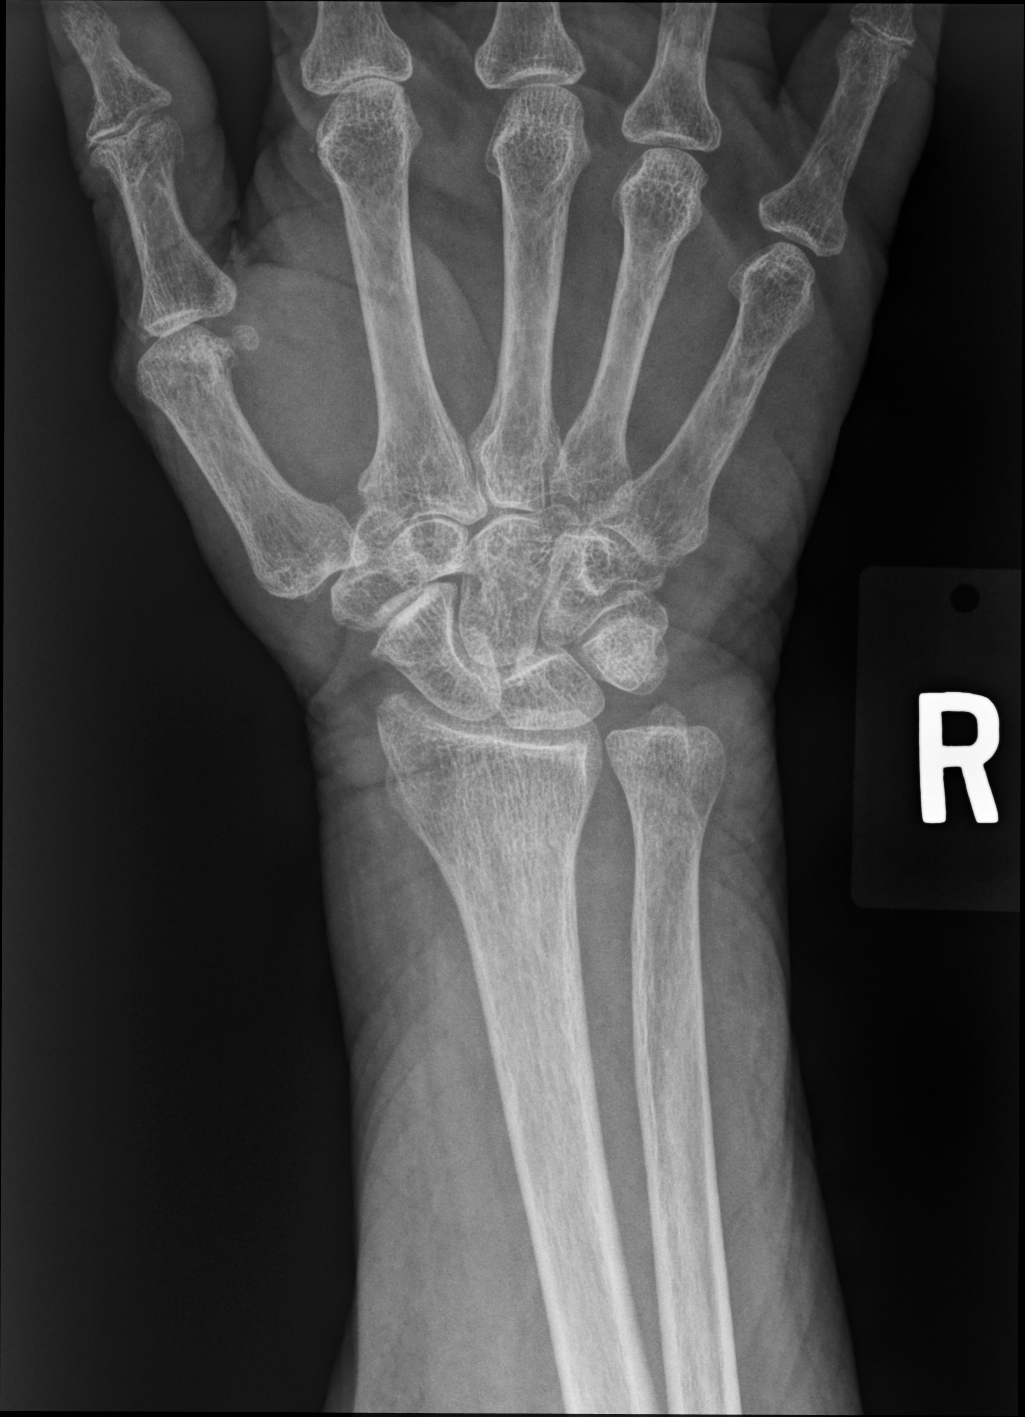

[wrist mlo]
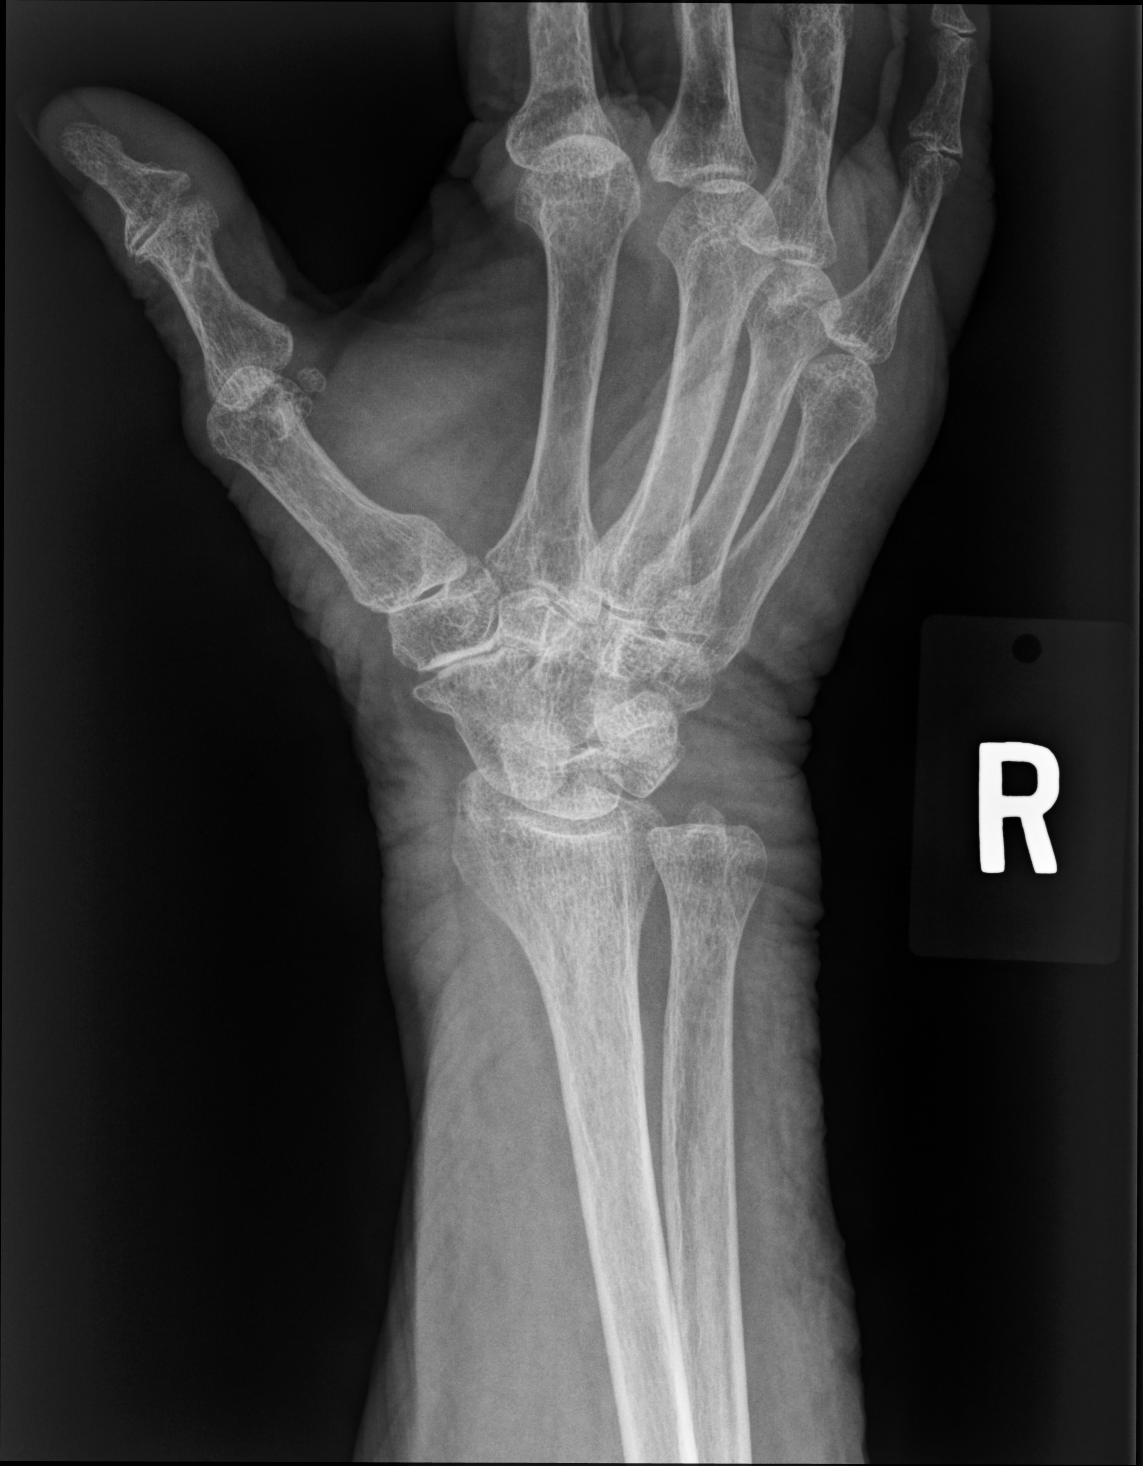

[wrist lat]
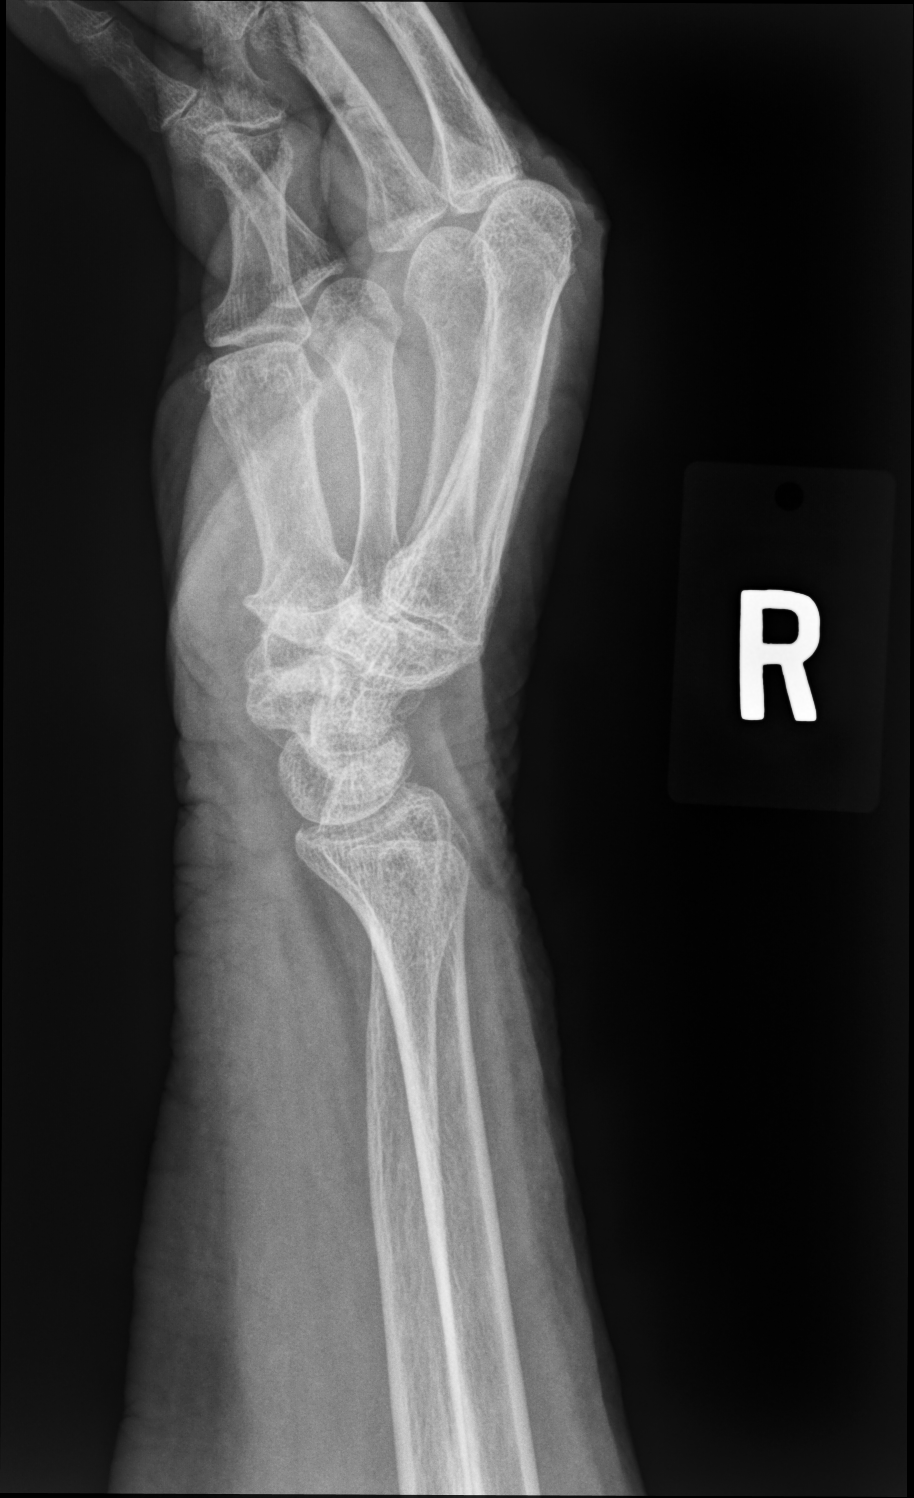

[scaphoid pa]
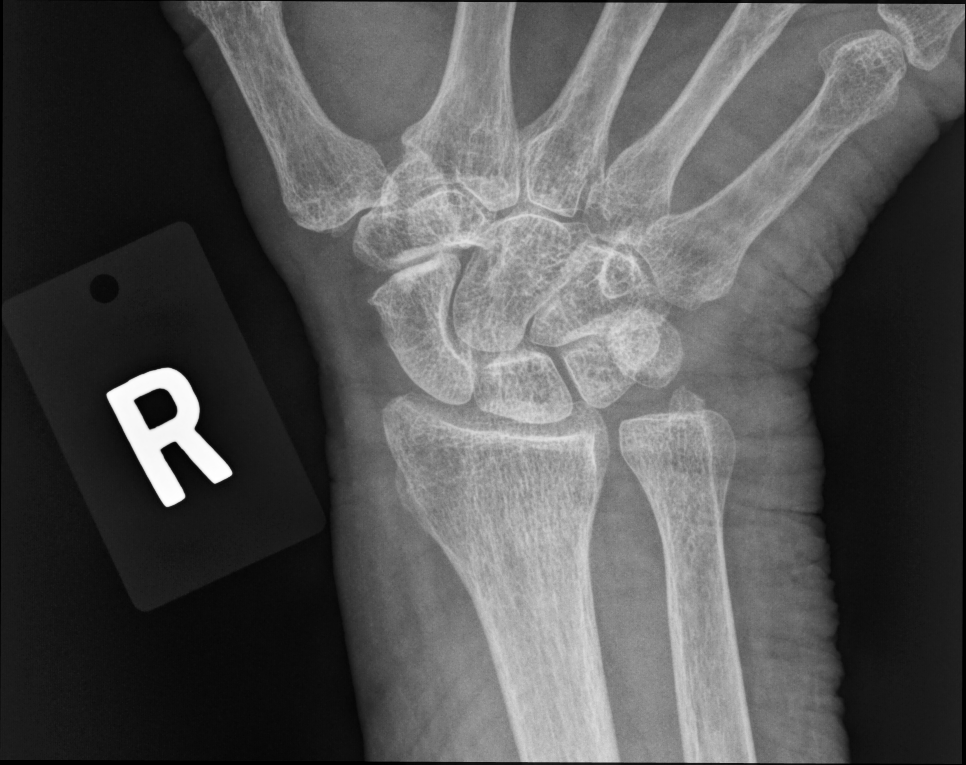

[4 of 4 positions shown; findings below may reference images not displayed]

FINDINGS: No acute fracture or dislocation. Mild scaphotrapeziotrapezoid,
thumb IP, and second MCP joint osteoarthritis. Osteopenia. Soft
tissues are unremarkable.
IMPRESSION: 1. No acute osseous abnormality.
